# Patient Record
Sex: Female | Born: 1950 | Race: White | Hispanic: No | Marital: Married | Smoking: Former smoker
Health system: Southern US, Community
[De-identification: ages and names within clinical notes are randomized; demographics above are authoritative.]

## PROBLEM LIST (undated history)

## (undated) DIAGNOSIS — E213 Hyperparathyroidism, unspecified: Secondary | ICD-10-CM

## (undated) DIAGNOSIS — J302 Other seasonal allergic rhinitis: Secondary | ICD-10-CM

## (undated) DIAGNOSIS — K5792 Diverticulitis of intestine, part unspecified, without perforation or abscess without bleeding: Secondary | ICD-10-CM

## (undated) DIAGNOSIS — M1990 Unspecified osteoarthritis, unspecified site: Secondary | ICD-10-CM

## (undated) DIAGNOSIS — Z91018 Allergy to other foods: Secondary | ICD-10-CM

## (undated) DIAGNOSIS — E049 Nontoxic goiter, unspecified: Secondary | ICD-10-CM

## (undated) DIAGNOSIS — R112 Nausea with vomiting, unspecified: Secondary | ICD-10-CM

## (undated) DIAGNOSIS — C4491 Basal cell carcinoma of skin, unspecified: Secondary | ICD-10-CM

## (undated) DIAGNOSIS — I10 Essential (primary) hypertension: Secondary | ICD-10-CM

## (undated) DIAGNOSIS — J45909 Unspecified asthma, uncomplicated: Secondary | ICD-10-CM

## (undated) DIAGNOSIS — Z9889 Other specified postprocedural states: Secondary | ICD-10-CM

## (undated) DIAGNOSIS — F419 Anxiety disorder, unspecified: Secondary | ICD-10-CM

## (undated) HISTORY — PX: OTHER SURGICAL HISTORY: SHX169

## (undated) HISTORY — PX: CHOLECYSTECTOMY: SHX55

## (undated) HISTORY — PX: SKIN CANCER EXCISION: SHX779

## (undated) HISTORY — DX: Allergy to other foods: Z91.018

## (undated) HISTORY — PX: EYE SURGERY: SHX253

## (undated) HISTORY — PX: CERVICAL FUSION: SHX112

## (undated) HISTORY — DX: Hyperparathyroidism, unspecified: E21.3

## (undated) HISTORY — PX: BREAST SURGERY: SHX581

---

## 2000-01-12 HISTORY — PX: BREAST EXCISIONAL BIOPSY: SUR124

## 2004-04-27 ENCOUNTER — Ambulatory Visit: Admission: RE | Admit: 2004-04-27 | Discharge: 2004-04-27 | Payer: Self-pay | Admitting: Neurosurgery

## 2004-06-04 ENCOUNTER — Ambulatory Visit (HOSPITAL_COMMUNITY): Admission: RE | Admit: 2004-06-04 | Discharge: 2004-06-05 | Payer: Self-pay | Admitting: Neurosurgery

## 2004-08-10 ENCOUNTER — Encounter: Admission: RE | Admit: 2004-08-10 | Discharge: 2004-08-10 | Payer: Self-pay | Admitting: Internal Medicine

## 2005-05-26 ENCOUNTER — Encounter: Admission: RE | Admit: 2005-05-26 | Discharge: 2005-05-26 | Payer: Self-pay | Admitting: Internal Medicine

## 2005-08-11 ENCOUNTER — Encounter: Admission: RE | Admit: 2005-08-11 | Discharge: 2005-08-11 | Payer: Self-pay | Admitting: Internal Medicine

## 2006-02-25 ENCOUNTER — Encounter: Admission: RE | Admit: 2006-02-25 | Discharge: 2006-02-25 | Payer: Self-pay | Admitting: Obstetrics and Gynecology

## 2006-08-23 ENCOUNTER — Encounter: Admission: RE | Admit: 2006-08-23 | Discharge: 2006-08-23 | Payer: Self-pay | Admitting: Obstetrics and Gynecology

## 2007-08-31 ENCOUNTER — Encounter: Admission: RE | Admit: 2007-08-31 | Discharge: 2007-08-31 | Payer: Self-pay | Admitting: Internal Medicine

## 2008-09-02 ENCOUNTER — Encounter: Admission: RE | Admit: 2008-09-02 | Discharge: 2008-09-02 | Payer: Self-pay | Admitting: Internal Medicine

## 2009-09-04 ENCOUNTER — Encounter: Admission: RE | Admit: 2009-09-04 | Discharge: 2009-09-04 | Payer: Self-pay | Admitting: Internal Medicine

## 2009-12-24 ENCOUNTER — Encounter
Admission: RE | Admit: 2009-12-24 | Discharge: 2009-12-24 | Payer: Self-pay | Source: Home / Self Care | Attending: Internal Medicine | Admitting: Internal Medicine

## 2010-05-29 NOTE — Op Note (Signed)
Stacy Beasley, Stacy Beasley                  ACCOUNT NO.:  [id number]   MEDICAL RECORD NO.:  [id number]          PATIENT TYPE:  OIB   LOCATION:  3014                         FACILITY:  [facility]   PHYSICIAN:  Cristi Loron, M.D.DATE OF BIRTH:  Apr 03, 1950   DATE OF PROCEDURE:  06/04/2004  DATE OF DISCHARGE:                                 OPERATIVE REPORT   PREOPERATIVE DIAGNOSIS:  C5-6 spondylosis, stenosis, cervicalgia, cervical  radiculopathy.   POSTOPERATIVE DIAGNOSIS:  C5-6 spondylosis, stenosis, cervicalgia, cervical  radiculopathy.   OPERATION PERFORMED:  C5-6 extensive anterior cervical  diskectomy/decompression; interbody iliac crest allograft arthrodesis;  anterior cervical plating (Codman Slim Lock titanium plate and screws).   SURGEON:  Cristi Loron, M.D.   ASSISTANT:  Stefani Dama, M.D.   ANESTHESIA:  General endotracheal.   ESTIMATED BLOOD LOSS:  50 cc.   SPECIMENS:  None.   DRAINS:  None.   COMPLICATIONS:  None.   INDICATIONS FOR PROCEDURE:  The patient is a 60 year old white female who  has suffered from neck and arm pain.  She has failed medical management and  was worked up with a cervical MRI which demonstrated significant spondylosis  and stenosis at C5-6.  I discussed the various treatment options with the  patient including surgery.  The patient has weighed the risks, benefits and  alternatives to surgery and decided to proceed with a C5-6 anterior cervical  diskectomy, interbody iliac crest allograft arthrodesis with anterior  cervical plate.   DESCRIPTION OF PROCEDURE:  The patient was brought to the operating room by  the anesthesia team.  General endotracheal anesthesia was induced.  The  patient remained in supine position.  A roll was placed under the shoulders  to place the neck in slight extension.  The anterior cervical region was  then prepared with Betadine scrub and Betadine solution.  Sterile drapes  were applied.  I then injected the  area to be incised with Marcaine with  epinephrine solution and used a scalpel to make a transverse incision in the  patient's left anterior neck.  I used Metzenbaum scissors to divide the  platysma muscle and then to dissect medial to the sternocleidomastoid  muscle, jugular vein and carotid artery.  I bluntly dissected down toward  the anterior cervical spine, carefully identifying the esophagus and  retracting it medially.  I then used Kitner swabs to clear the soft tissue  from the anterior cervical spine.  I then inserted a bent spinal needle into  the exposed intervertebral interspace.  We obtained intraoperative  radiograph to confirm our location.   We then used electrocautery to detach the medial border of the longus colli  muscle bilaterally from the C5-6 intervertebral disk space.  I then inserted  a Caspar self-retaining retractor for exposure.   We then began the decompression by incising the C5-6 intervertebral disk  with a 15 blade scalpel.  The disk space was quite spondylotic and  collapsed.  We used the pituitary forceps to perform a partial diskectomy.  We inserted distraction screws at C5 and C6, distracted the interspace and  then used a high speed drill to decorticate vertebral end plates at Z6-1,  drill away the remainder of  the C5-6 intervertebral disk to drill away the  significant posterior spondylosis.  We thinned out the posterior  longitudinal ligament with the drill.  We then used the arachnoid knife to  incise the ligament and then removed it with the Kerrison punch undercutting  the vertebral end plates at W9-6, decompressing the thecal sac.  We then  performed a generous foraminotomy about the bilateral C6 nerve roots  completing the decompression.   We now turned our attention to arthrodesis.  We obtained iliac crest  tricortical allograft bone graft and fashioned it to these approximate  dimensions.  7 mm height, 1 cm in depth.  I inserted the bone  graft at the  distracted C5-6 interspace.  We removed the distraction screws and there was  a good snug fit of the bone graft, completing the arthrodesis.   We now turned our attention to the anterior spinal instrumentation. We  selected the appropriate length of Codman Slim Lock anterior cervical plate.  We then used the high speed drill to drill away some ventral spondylosis  from the C5-6 interspace so that the plate would lie down flat.  We then  laid the plate along the anterior aspect of the vertebral bodies at C5 and  C6.  We drilled two 12 mm holes at C5, two at C6.  We then secured the plate  to the vertebral bodies by placing two 12 mm self tapping screws at C5, two  at C6.  We then obtained an intraoperative radiograph to confirm our  location.  It confirmed that there was good position of the plates, screws,  interbody graft at C5-6.  We therefore secured the screws to the plate by  locking each cam.  We then obtained stringent hemostasis using bipolar  electrocautery.  We copiously irrigated the wound out with bacitracin  solution, removed the solution and then removed the Caspar self-retaining  retractor.  We inspected the esophagus for any damage.  There was none  apparent.  We then reapproximated the patient's platysma muscle with  interrupted 3-0 Vicryl sutures, subcutaneous tissues with interrupted 3-0  Vicryl suture and the skin with Benzoin and Steri-Strips.  The wound was  then coated with bacitracin ointment.  A dry sterile dressing was applied.  The drapes were removed.  The patient was subsequently extubated by the  anesthesia team and transported to the post anesthesia care unit in stable  condition.  All sponge, needle and instrument counts were correct at the end  of this case.      JDJ/MEDQ  D:  06/04/2004  T:  06/05/2004  Job:  [id number]

## 2010-07-28 ENCOUNTER — Other Ambulatory Visit: Payer: Self-pay | Admitting: Internal Medicine

## 2010-07-28 DIAGNOSIS — Z1231 Encounter for screening mammogram for malignant neoplasm of breast: Secondary | ICD-10-CM

## 2010-09-07 ENCOUNTER — Ambulatory Visit: Payer: Self-pay

## 2010-09-22 ENCOUNTER — Ambulatory Visit
Admission: RE | Admit: 2010-09-22 | Discharge: 2010-09-22 | Disposition: A | Discharge status: Home / Self Care | Payer: BC Managed Care – PPO | Source: Ambulatory Visit | Attending: Internal Medicine | Admitting: Internal Medicine

## 2010-09-22 DIAGNOSIS — Z1231 Encounter for screening mammogram for malignant neoplasm of breast: Secondary | ICD-10-CM

## 2011-08-19 ENCOUNTER — Other Ambulatory Visit: Payer: Self-pay | Admitting: Internal Medicine

## 2011-08-19 DIAGNOSIS — Z1231 Encounter for screening mammogram for malignant neoplasm of breast: Secondary | ICD-10-CM

## 2011-09-23 ENCOUNTER — Ambulatory Visit
Admission: RE | Admit: 2011-09-23 | Discharge: 2011-09-23 | Disposition: A | Discharge status: Home / Self Care | Payer: BC Managed Care – PPO | Source: Ambulatory Visit | Attending: Internal Medicine | Admitting: Internal Medicine

## 2011-09-23 DIAGNOSIS — Z1231 Encounter for screening mammogram for malignant neoplasm of breast: Secondary | ICD-10-CM

## 2012-08-24 ENCOUNTER — Other Ambulatory Visit: Payer: Self-pay

## 2012-08-24 DIAGNOSIS — Z1231 Encounter for screening mammogram for malignant neoplasm of breast: Secondary | ICD-10-CM

## 2012-09-25 ENCOUNTER — Ambulatory Visit
Admission: RE | Admit: 2012-09-25 | Discharge: 2012-09-25 | Disposition: A | Discharge status: Home / Self Care | Payer: BC Managed Care – PPO | Source: Ambulatory Visit

## 2012-09-25 DIAGNOSIS — Z1231 Encounter for screening mammogram for malignant neoplasm of breast: Secondary | ICD-10-CM

## 2012-12-11 ENCOUNTER — Other Ambulatory Visit: Payer: Self-pay | Admitting: Endocrinology

## 2012-12-11 DIAGNOSIS — E041 Nontoxic single thyroid nodule: Secondary | ICD-10-CM

## 2012-12-19 ENCOUNTER — Ambulatory Visit
Admission: RE | Admit: 2012-12-19 | Discharge: 2012-12-19 | Disposition: A | Discharge status: Home / Self Care | Payer: BC Managed Care – PPO | Source: Ambulatory Visit | Attending: Endocrinology | Admitting: Endocrinology

## 2012-12-19 ENCOUNTER — Other Ambulatory Visit (HOSPITAL_COMMUNITY)
Admission: RE | Admit: 2012-12-19 | Discharge: 2012-12-19 | Disposition: A | Discharge status: Home / Self Care | Payer: BC Managed Care – PPO | Source: Ambulatory Visit | Attending: Interventional Radiology | Admitting: Interventional Radiology

## 2012-12-19 DIAGNOSIS — E041 Nontoxic single thyroid nodule: Secondary | ICD-10-CM | POA: Insufficient documentation

## 2013-08-27 ENCOUNTER — Other Ambulatory Visit: Payer: Self-pay

## 2013-08-27 DIAGNOSIS — Z1231 Encounter for screening mammogram for malignant neoplasm of breast: Secondary | ICD-10-CM

## 2013-09-26 ENCOUNTER — Ambulatory Visit
Admission: RE | Admit: 2013-09-26 | Discharge: 2013-09-26 | Disposition: A | Discharge status: Home / Self Care | Payer: BC Managed Care – PPO | Source: Ambulatory Visit

## 2013-09-26 DIAGNOSIS — Z1231 Encounter for screening mammogram for malignant neoplasm of breast: Secondary | ICD-10-CM

## 2014-09-09 ENCOUNTER — Other Ambulatory Visit: Payer: Self-pay

## 2014-09-09 DIAGNOSIS — Z1231 Encounter for screening mammogram for malignant neoplasm of breast: Secondary | ICD-10-CM

## 2014-09-25 ENCOUNTER — Other Ambulatory Visit: Payer: Self-pay | Admitting: Orthopedic Surgery

## 2014-10-07 ENCOUNTER — Ambulatory Visit
Admission: RE | Admit: 2014-10-07 | Discharge: 2014-10-07 | Disposition: A | Discharge status: Home / Self Care | Payer: 59 | Source: Ambulatory Visit

## 2014-10-07 DIAGNOSIS — Z1231 Encounter for screening mammogram for malignant neoplasm of breast: Secondary | ICD-10-CM

## 2014-10-09 ENCOUNTER — Encounter (HOSPITAL_COMMUNITY): Payer: Self-pay

## 2014-10-09 ENCOUNTER — Encounter (HOSPITAL_COMMUNITY)
Admission: RE | Admit: 2014-10-09 | Discharge: 2014-10-09 | Disposition: A | Discharge status: Home / Self Care | Payer: 59 | Source: Ambulatory Visit | Attending: Orthopedic Surgery | Admitting: Orthopedic Surgery

## 2014-10-09 DIAGNOSIS — Z01818 Encounter for other preprocedural examination: Secondary | ICD-10-CM | POA: Insufficient documentation

## 2014-10-09 DIAGNOSIS — M2391 Unspecified internal derangement of right knee: Secondary | ICD-10-CM | POA: Diagnosis not present

## 2014-10-09 DIAGNOSIS — Z01812 Encounter for preprocedural laboratory examination: Secondary | ICD-10-CM | POA: Diagnosis not present

## 2014-10-09 DIAGNOSIS — I498 Other specified cardiac arrhythmias: Secondary | ICD-10-CM | POA: Diagnosis not present

## 2014-10-09 HISTORY — DX: Unspecified osteoarthritis, unspecified site: M19.90

## 2014-10-09 HISTORY — DX: Nausea with vomiting, unspecified: R11.2

## 2014-10-09 HISTORY — DX: Other seasonal allergic rhinitis: J30.2

## 2014-10-09 HISTORY — DX: Essential (primary) hypertension: I10

## 2014-10-09 HISTORY — DX: Nontoxic goiter, unspecified: E04.9

## 2014-10-09 HISTORY — DX: Unspecified asthma, uncomplicated: J45.909

## 2014-10-09 HISTORY — DX: Other specified postprocedural states: Z98.890

## 2014-10-09 LAB — BASIC METABOLIC PANEL
ANION GAP: 8 (ref 5–15)
BUN: 17 mg/dL (ref 6–20)
CO2: 30 mmol/L (ref 22–32)
Calcium: 10.1 mg/dL (ref 8.9–10.3)
Chloride: 102 mmol/L (ref 101–111)
Creatinine, Ser: 0.98 mg/dL (ref 0.44–1.00)
GFR calc Af Amer: >60 mL/min (ref >60)
GLUCOSE: 90 mg/dL (ref 65–99)
POTASSIUM: 3.9 mmol/L (ref 3.5–5.1)
Sodium: 140 mmol/L (ref 135–145)

## 2014-10-09 LAB — CBC
HCT: 39.9 % (ref 36.0–46.0)
Hemoglobin: 13.2 g/dL (ref 12.0–15.0)
MCH: 30.6 pg (ref 26.0–34.0)
MCHC: 33.1 g/dL (ref 30.0–36.0)
MCV: 92.6 fL (ref 78.0–100.0)
Platelets: 215 K/uL (ref 150–400)
RBC: 4.31 MIL/uL (ref 3.87–5.11)
RDW: 13.2 % (ref 11.5–15.5)
WBC: 6.6 K/uL (ref 4.0–10.5)

## 2014-10-09 NOTE — Pre-Procedure Instructions (Signed)
    JANESHIA CILIBERTO  10/09/2014      [facility] [place], [place] [address] [place] [address] Phone: [phone] Fax: [phone]    Your procedure is scheduled on 10-21-2014  Monday    Report to [facility] Admitting at 10:50 AM  .  Call this number if you have problems the morning of surgery:  [phone]   Remember:  Do not eat food or drink liquids after midnight.   Take these medicines the morning of surgery with A SIP OF WATER cetirizine(zyrtec)    Do not wear jewelry, make-up or nail polish.  Do not wear lotions, powders, or perfumes.  You may not wear deodorant.  Do not shave 48 hours prior to surgery.     Do not bring valuables to the hospital.  [facility] is not responsible for any belongings or valuables.  Contacts, dentures or bridgework may not be worn into surgery.  Leave your suitcase in the car.  After surgery it may be brought to your room.  For patients admitted to the hospital, discharge time will be determined by your treatment team.  Patients discharged the day of surgery will not be allowed to drive home.    Special instructions:  See attached Sheet for instructions on CHG shower.  Please read over the following fact sheets that you were given. Pain Booklet and Surgical Site Infection Prevention

## 2014-10-21 ENCOUNTER — Ambulatory Visit (HOSPITAL_COMMUNITY): Payer: 59 | Admitting: Anesthesiology

## 2014-10-21 ENCOUNTER — Ambulatory Visit (HOSPITAL_COMMUNITY)
Admission: RE | Admit: 2014-10-21 | Discharge: 2014-10-21 | Disposition: A | Discharge status: Home / Self Care | Payer: 59 | Source: Ambulatory Visit | Attending: Orthopedic Surgery | Admitting: Orthopedic Surgery

## 2014-10-21 ENCOUNTER — Encounter (HOSPITAL_COMMUNITY)
Admission: RE | Disposition: A | Discharge status: Home / Self Care | Payer: Self-pay | Source: Ambulatory Visit | Attending: Orthopedic Surgery

## 2014-10-21 ENCOUNTER — Encounter (HOSPITAL_COMMUNITY): Payer: Self-pay | Admitting: *Deleted

## 2014-10-21 DIAGNOSIS — X58XXXA Exposure to other specified factors, initial encounter: Secondary | ICD-10-CM | POA: Insufficient documentation

## 2014-10-21 DIAGNOSIS — M6751 Plica syndrome, right knee: Secondary | ICD-10-CM | POA: Insufficient documentation

## 2014-10-21 DIAGNOSIS — S83241A Other tear of medial meniscus, current injury, right knee, initial encounter: Secondary | ICD-10-CM | POA: Insufficient documentation

## 2014-10-21 DIAGNOSIS — I10 Essential (primary) hypertension: Secondary | ICD-10-CM | POA: Diagnosis not present

## 2014-10-21 DIAGNOSIS — M1711 Unilateral primary osteoarthritis, right knee: Secondary | ICD-10-CM | POA: Diagnosis not present

## 2014-10-21 DIAGNOSIS — Z87891 Personal history of nicotine dependence: Secondary | ICD-10-CM | POA: Diagnosis not present

## 2014-10-21 HISTORY — PX: KNEE ARTHROSCOPY: SHX127

## 2014-10-21 SURGERY — ARTHROSCOPY, KNEE
Anesthesia: Spinal | Site: Knee | Laterality: Right

## 2014-10-21 MED ORDER — ONDANSETRON HCL 4 MG/2ML IJ SOLN
INTRAMUSCULAR | Status: AC
  Filled 2014-10-21: qty 2

## 2014-10-21 MED ORDER — VANCOMYCIN HCL IN DEXTROSE 1-5 GM/200ML-% IV SOLN
1000.0000 mg | INTRAVENOUS | Status: AC
  Administered 2014-10-21: 1000 mg via INTRAVENOUS
  Filled 2014-10-21: qty 200

## 2014-10-21 MED ORDER — PROPOFOL 10 MG/ML IV BOLUS
INTRAVENOUS | Status: AC
  Filled 2014-10-21: qty 20

## 2014-10-21 MED ORDER — IBUPROFEN 600 MG PO TABS: 600.0000 mg | ORAL_TABLET | Freq: Three times a day (TID) | ORAL | Status: DC

## 2014-10-21 MED ORDER — PROPOFOL 10 MG/ML IV BOLUS
INTRAVENOUS | Status: DC | PRN
  Administered 2014-10-21: 20 mg via INTRAVENOUS
  Administered 2014-10-21 (×3): 40 mg via INTRAVENOUS
  Administered 2014-10-21: 20 mg via INTRAVENOUS

## 2014-10-21 MED ORDER — LIDOCAINE HCL (CARDIAC) 20 MG/ML IV SOLN
INTRAVENOUS | Status: AC
  Filled 2014-10-21: qty 5

## 2014-10-21 MED ORDER — CHLORHEXIDINE GLUCONATE 4 % EX LIQD: 60.0000 mL | Freq: Once | CUTANEOUS | Status: DC

## 2014-10-21 MED ORDER — ONDANSETRON HCL 4 MG/2ML IJ SOLN
INTRAMUSCULAR | Status: DC | PRN
  Administered 2014-10-21: 4 mg via INTRAVENOUS

## 2014-10-21 MED ORDER — GLYCOPYRROLATE 0.2 MG/ML IJ SOLN
INTRAMUSCULAR | Status: AC
  Filled 2014-10-21: qty 1

## 2014-10-21 MED ORDER — LACTATED RINGERS IV SOLN
INTRAVENOUS | Status: DC
  Administered 2014-10-21 (×2): via INTRAVENOUS

## 2014-10-21 MED ORDER — GLYCOPYRROLATE 0.2 MG/ML IJ SOLN
INTRAMUSCULAR | Status: DC | PRN
  Administered 2014-10-21: 0.2 mg via INTRAVENOUS

## 2014-10-21 MED ORDER — HYDROCODONE-ACETAMINOPHEN 5-325 MG PO TABS: 1.0000 | ORAL_TABLET | ORAL | Status: DC | PRN

## 2014-10-21 MED ORDER — DEXAMETHASONE SODIUM PHOSPHATE 4 MG/ML IJ SOLN
INTRAMUSCULAR | Status: DC | PRN
  Administered 2014-10-21: 4 mg via INTRAVENOUS

## 2014-10-21 MED ORDER — LIDOCAINE HCL (CARDIAC) 20 MG/ML IV SOLN
INTRAVENOUS | Status: DC | PRN
  Administered 2014-10-21: 60 mg via INTRAVENOUS

## 2014-10-21 MED ORDER — DEXAMETHASONE SODIUM PHOSPHATE 4 MG/ML IJ SOLN
INTRAMUSCULAR | Status: AC
  Filled 2014-10-21: qty 1

## 2014-10-21 MED ORDER — MIDAZOLAM HCL 2 MG/2ML IJ SOLN
INTRAMUSCULAR | Status: DC | PRN
  Administered 2014-10-21 (×2): 1 mg via INTRAVENOUS

## 2014-10-21 MED ORDER — MIDAZOLAM HCL 2 MG/2ML IJ SOLN
INTRAMUSCULAR | Status: AC
  Filled 2014-10-21: qty 4

## 2014-10-21 MED ORDER — PROPOFOL 500 MG/50ML IV EMUL
INTRAVENOUS | Status: DC | PRN
  Administered 2014-10-21: 25 ug/kg/min via INTRAVENOUS

## 2014-10-21 MED ORDER — SODIUM CHLORIDE 0.9 % IR SOLN
Status: DC | PRN
  Administered 2014-10-21: 3000 mL

## 2014-10-21 MED ORDER — BUPIVACAINE IN DEXTROSE 0.75-8.25 % IT SOLN
INTRATHECAL | Status: DC | PRN
  Administered 2014-10-21: 1.4 mL via INTRATHECAL

## 2014-10-21 SURGICAL SUPPLY — 29 items
BANDAGE ELASTIC 6 VELCRO ST LF (GAUZE/BANDAGES/DRESSINGS) ×2 IMPLANT
BLADE GREAT WHITE 4.2 (BLADE) ×2 IMPLANT
DRAPE ARTHROSCOPY W/POUCH 114 (DRAPES) ×2 IMPLANT
DRAPE U-SHAPE 47X51 STRL (DRAPES) ×2 IMPLANT
GAUZE SPONGE 4X4 12PLY STRL (GAUZE/BANDAGES/DRESSINGS) ×2 IMPLANT
GAUZE XEROFORM 1X8 LF (GAUZE/BANDAGES/DRESSINGS) ×2 IMPLANT
GLOVE BIO SURGEON STRL SZ 6.5 (GLOVE) ×4 IMPLANT
GLOVE BIOGEL PI IND STRL 8.5 (GLOVE) ×2 IMPLANT
GLOVE BIOGEL PI INDICATOR 8.5 (GLOVE) ×2
GLOVE SURG ORTHO 8.0 STRL STRW (GLOVE) ×4 IMPLANT
GOWN STRL REUS W/ TWL LRG LVL3 (GOWN DISPOSABLE) ×1 IMPLANT
GOWN STRL REUS W/TWL LRG LVL3 (GOWN DISPOSABLE) ×1
GOWN STRL REUS W/TWL XL LVL3 (GOWN DISPOSABLE) ×4 IMPLANT
KIT ROOM TURNOVER OR (KITS) ×2 IMPLANT
MANIFOLD NEPTUNE II (INSTRUMENTS) ×2 IMPLANT
PACK ARTHROSCOPY DSU (CUSTOM PROCEDURE TRAY) ×2 IMPLANT
PAD ARMBOARD 7.5X6 YLW CONV (MISCELLANEOUS) ×4 IMPLANT
PAD CAST 4YDX4 CTTN HI CHSV (CAST SUPPLIES) ×1 IMPLANT
PADDING CAST COTTON 4X4 STRL (CAST SUPPLIES) ×1
SET ARTHROSCOPY TUBING (MISCELLANEOUS) ×1
SET ARTHROSCOPY TUBING LN (MISCELLANEOUS) ×1 IMPLANT
SPONGE GAUZE 4X4 12PLY STER LF (GAUZE/BANDAGES/DRESSINGS) ×2 IMPLANT
SPONGE LAP 4X18 X RAY DECT (DISPOSABLE) ×2 IMPLANT
SUT ETHILON 4 0 PS 2 18 (SUTURE) ×2 IMPLANT
SYR 30ML LL (SYRINGE) IMPLANT
TOWEL OR 17X24 6PK STRL BLUE (TOWEL DISPOSABLE) ×2 IMPLANT
TOWEL OR 17X26 10 PK STRL BLUE (TOWEL DISPOSABLE) ×2 IMPLANT
WAND 90 DEG TURBOVAC W/CORD (SURGICAL WAND) ×2 IMPLANT
WATER STERILE IRR 1000ML POUR (IV SOLUTION) ×2 IMPLANT

## 2014-10-21 NOTE — Transfer of Care (Signed)
Immediate Anesthesia Transfer of Care Note  Patient: Stacy Beasley  Procedure(s) Performed: Procedure(s): ARTHROSCOPY KNEE (Right)  Patient Location: PACU  Anesthesia Type:Spinal  Level of Consciousness: awake, alert  and oriented  Airway & Oxygen Therapy: Patient Spontanous Breathing and Patient connected to nasal cannula oxygen  Post-op Assessment: Report given to RN, Post -op Vital signs reviewed and stable and Patient moving all extremities X 4  Post vital signs: Reviewed and stable  Last Vitals:  Filed Vitals:   10/21/14 1050  BP: 143/84  Pulse: 71  Temp: 36.5 C  Resp: 20    Complications: No apparent anesthesia complications

## 2014-10-21 NOTE — H&P (Signed)
  Stacy Beasley MRN:  [id number] DOB/SEX:  1950-05-07/female  CHIEF COMPLAINT:  Painful right Knee  HISTORY: Patient is a 64 y.o. female presented with a history of pain in the right knee. Onset of symptoms was abrupt starting several weeks ago with rapidly worsening course since that time. Prior procedures on the knee include none. Patient has been treated conservatively with over-the-counter NSAIDs and activity modification. Patient currently rates pain in the knee at 9 out of 10 with activity. There is no pain at night.  PAST MEDICAL HISTORY: There are no active problems to display for this patient.  Past Medical History  Diagnosis Date  . PONV (postoperative nausea and vomiting)   . Hypertension   . Asthma     as child  . Goiter, non-toxic   . Arthritis   . Seasonal allergies    Past Surgical History  Procedure Laterality Date  . Cervical fusion    . Cholecystectomy    . Eye surgery Bilateral     cataracts  . Arthroscopic knee  Left   . Breast surgery      lumpectomy     MEDICATIONS:   No prescriptions prior to admission    ALLERGIES:   Allergies  Allergen Reactions  . Doxycycline   . Penicillins   . Phenobarbital Other (See Comments)    hyperactivity  . Levaquin [Levofloxacin In D5w] Rash    REVIEW OF SYSTEMS:  Pertinent items noted in HPI and remainder of comprehensive ROS otherwise negative.   FAMILY HISTORY:  No family history on file.  SOCIAL HISTORY:   Social History  Substance Use Topics  . Smoking status: Former Smoker -- 1.00 packs/day for 8 years    Types: Cigarettes    Quit date: 01/11/1978  . Smokeless tobacco: Not on file  . Alcohol Use: Yes     Comment: socially     EXAMINATION:  Vital signs in last 24 hours:    General appearance: alert, cooperative and no distress Lungs: clear to auscultation bilaterally Heart: regular rate and rhythm, S1, S2 normal, no murmur, click, rub or gallop Abdomen: soft, non-tender; bowel sounds normal;  no masses,  no organomegaly Extremities: extremities normal, atraumatic, no cyanosis or edema and Homans sign is negative, no sign of DVT Pulses: 2+ and symmetric Skin: Skin color, texture, turgor normal. No rashes or lesions Neurologic: Alert and oriented X 3, normal strength and tone. Normal symmetric reflexes. Normal coordination and gait  Musculoskeletal:  ROM 0-110, Ligaments intact,  Imaging Review Plain radiographs demonstrate mild degenerative joint disease of the right knee. The overall alignment is neutral. The bone quality appears to be good for age and reported activity level.  Assessment/Plan:  right knee meniscal tear  The patient history, physical examination and imaging studies are consistent with mild degenerative joint disease of the right knee. The patient has failed conservative treatment.  The clearance notes were reviewed.  After discussion with the patient it was felt that  Knee Arthoscopy was indicated.   Channell Quattrone 10/21/2014, 6:49 AM

## 2014-10-21 NOTE — Anesthesia Postprocedure Evaluation (Signed)
  Anesthesia Post-op Note  Patient: Stacy Beasley  Procedure(s) Performed: Procedure(s) (LRB): ARTHROSCOPY KNEE (Right)  Patient Location: PACU  Anesthesia Type: Spinal  Level of Consciousness: awake and alert   Airway and Oxygen Therapy: Patient Spontanous Breathing  Post-op Pain: mild  Post-op Assessment: Post-op Vital signs reviewed, Patient's Cardiovascular Status Stable, Respiratory Function Stable, Patent Airway and No signs of Nausea or vomiting  Last Vitals:  Filed Vitals:   10/21/14 1410  BP:   Pulse:   Temp: 36.4 C  Resp:     Post-op Vital Signs: stable   Complications: No apparent anesthesia complications

## 2014-10-21 NOTE — Progress Notes (Signed)
Orthopedic Tech Progress Note Patient Details:  Stacy Beasley 1950-10-09 [id number]  Ortho Devices Type of Ortho Device: Crutches Ortho Device/Splint Interventions: Application Viewed order from RN order list  Hildred Priest 10/21/2014, 3:14 PM

## 2014-10-21 NOTE — Anesthesia Preprocedure Evaluation (Signed)
Anesthesia Evaluation  Patient identified by MRN, date of birth, ID band Patient awake    Reviewed: Allergy & Precautions, NPO status , Patient's Chart, lab work & pertinent test results  Airway Mallampati: II  TM Distance: >3 FB Neck ROM: Full    Dental no notable dental hx.    Pulmonary neg pulmonary ROS, former smoker,    Pulmonary exam normal breath sounds clear to auscultation       Cardiovascular hypertension, Pt. on medications Normal cardiovascular exam Rhythm:Regular Rate:Normal     Neuro/Psych negative neurological ROS  negative psych ROS   GI/Hepatic negative GI ROS, Neg liver ROS,   Endo/Other  negative endocrine ROS  Renal/GU negative Renal ROS  negative genitourinary   Musculoskeletal negative musculoskeletal ROS (+)   Abdominal   Peds negative pediatric ROS (+)  Hematology negative hematology ROS (+)   Anesthesia Other Findings   Reproductive/Obstetrics negative OB ROS                             Anesthesia Physical Anesthesia Plan  ASA: II  Anesthesia Plan: Spinal   Post-op Pain Management:    Induction: Intravenous  Airway Management Planned:   Additional Equipment:   Intra-op Plan:   Post-operative Plan:   Informed Consent: I have reviewed the patients History and Physical, chart, labs and discussed the procedure including the risks, benefits and alternatives for the proposed anesthesia with the patient or authorized representative who has indicated his/her understanding and acceptance.   Dental advisory given  Plan Discussed with: CRNA and Surgeon  Anesthesia Plan Comments:         Anesthesia Quick Evaluation

## 2014-10-21 NOTE — Anesthesia Procedure Notes (Signed)
Spinal Patient location during procedure: OR Staffing Performed by: anesthesiologist  Preanesthetic Checklist Completed: patient identified, site marked, surgical consent, pre-op evaluation, timeout performed, IV checked, risks and benefits discussed and monitors and equipment checked Spinal Block Patient position: sitting Prep: Betadine Patient monitoring: heart rate, continuous pulse ox and blood pressure Injection technique: single-shot Needle Needle type: Spinocan  Needle gauge: 22 G Needle length: 9 cm Additional Notes Expiration date of kit checked and confirmed. Patient tolerated procedure well, without complications.  Attempt x 3, significant scoliosis

## 2014-10-21 NOTE — Discharge Instructions (Signed)
Diet: As you were doing prior to hospitalization   Activity:  Increase activity slowly as tolerated                  No lifting or driving for 6 weeks  Shower:  May shower without a dressing once there is no drainage from your wound.                 Do NOT wash over the wound.                 Dressing:  You may change your dressing on Wednesday                    Then change the dressing daily with sterile 4"x4"s gauze dressing                     And ACE wrap hose for knees.  Weight Bearing:  Weight bearing as tolerated as taught in physical therapy.  Use a                                walker or Crutches as instructed.  To prevent constipation: you may use a stool softener such as -               Colace ( over the counter) 100 mg by mouth twice a day                Drink plenty of fluids ( prune juice may be helpful) and high fiber foods                Miralax ( over the counter) for constipation as needed.    Precautions:  If you experience chest pain or shortness of breath - call 911 immediately               For transfer to the hospital emergency department!!               If you develop a fever greater that 101 F, purulent drainage from wound,                             increased redness or drainage from wound, or calf pain -- Call the office.  Follow- Up Appointment:  Please call for an appointment to be seen on 10/24/14                                              [facility] office:  [phone]            [address] [place], [place] [address]

## 2014-10-22 ENCOUNTER — Encounter (HOSPITAL_COMMUNITY): Payer: Self-pay | Admitting: Orthopedic Surgery

## 2014-10-22 NOTE — Op Note (Signed)
Dictation Number:  [id number]

## 2014-10-23 NOTE — Op Note (Signed)
Stacy Beasley, Stacy Beasley                  ACCOUNT NO.:  [id number]  MEDICAL RECORD NO.:  [id number]  LOCATION:  [facility]                         FACILITY:  [facility]  PHYSICIAN:  Estill Bamberg. Ronnie Derby, M.D. DATE OF BIRTH:  02/17/1950  DATE OF PROCEDURE:  10/21/2014 DATE OF DISCHARGE:  10/21/2014                              OPERATIVE REPORT   SURGEON:  Estill Bamberg. Ronnie Derby, M.D.  ASSISTANT:  Carlynn Spry, PA-C.  ANESTHESIA:  General.  PREOPERATIVE DIAGNOSIS:  Right knee osteoarthritis, medial meniscal tear, and plica syndrome.  POSTOPERATIVE DIAGNOSIS:  Right knee osteoarthritis, medial meniscal tear, and plica syndrome.  PROCEDURE:  Right knee arthroscopy with chondroplasty in the patellofemoral joint, plicectomy in the patellofemoral joint, and partial medial meniscectomy.  INDICATION FOR PROCEDURE:  The patient is a 64 year old, white female, with mechanical symptoms.  MRI evidence of meniscal tearing.  An informed consent was obtained.  DESCRIPTION OF PROCEDURE:  The patient was laid supine, administered general anesthesia.  The right knee was prepped and draped in usual fashion.  Inferolateral and inferomedial portals were created with a #11 blade.  Blunt trocar and cannula.  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  Chondroplasty is carried out.  Small [place] shaver through the anteromedial portal. There was a large mature medial plica, this was resected with a small [employer] shaver elicited.  They went into plica .  The medial compartment showed grade II to III chondromalacia and right chondroplasty was carried out.  There was dense tearing in the entirety of the posterior horn of the meniscus.  Straight basket forceps and [place] shaver was used to perform partial medial meniscectomy.  ACL and PCL were normal. The lateral compartment looked good.  I then lavaged with sterile 3 compartments yet again.  I do think that the  patient came to arthroplasty, partial knee replacement would be reasonable.  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  We dressed with Xeroform, dressing, sponges, sterile Webril, and Ace wrap.  COMPLICATIONS:  None.  DRAINS:  None.          ______________________________ Estill Bamberg. Ronnie Derby, M.D.     SDL/MEDQ  D:  10/22/2014  T:  10/23/2014  Job:  [id number]

## 2015-09-09 ENCOUNTER — Other Ambulatory Visit: Payer: Self-pay | Admitting: Internal Medicine

## 2015-09-09 DIAGNOSIS — Z1231 Encounter for screening mammogram for malignant neoplasm of breast: Secondary | ICD-10-CM

## 2015-10-13 ENCOUNTER — Ambulatory Visit
Admission: RE | Admit: 2015-10-13 | Discharge: 2015-10-13 | Disposition: A | Discharge status: Home / Self Care | Payer: BLUE CROSS/BLUE SHIELD | Source: Ambulatory Visit | Attending: Internal Medicine | Admitting: Internal Medicine

## 2015-10-13 DIAGNOSIS — Z1231 Encounter for screening mammogram for malignant neoplasm of breast: Secondary | ICD-10-CM

## 2015-10-14 ENCOUNTER — Other Ambulatory Visit: Payer: Self-pay | Admitting: Internal Medicine

## 2015-10-14 DIAGNOSIS — N6459 Other signs and symptoms in breast: Secondary | ICD-10-CM

## 2015-10-16 ENCOUNTER — Ambulatory Visit
Admission: RE | Admit: 2015-10-16 | Discharge: 2015-10-16 | Disposition: A | Discharge status: Home / Self Care | Payer: BLUE CROSS/BLUE SHIELD | Source: Ambulatory Visit | Attending: Internal Medicine | Admitting: Internal Medicine

## 2015-10-16 DIAGNOSIS — N6459 Other signs and symptoms in breast: Secondary | ICD-10-CM

## 2016-03-17 DIAGNOSIS — J209 Acute bronchitis, unspecified: Secondary | ICD-10-CM | POA: Diagnosis not present

## 2016-03-21 DIAGNOSIS — J209 Acute bronchitis, unspecified: Secondary | ICD-10-CM | POA: Diagnosis not present

## 2016-05-10 DIAGNOSIS — M1711 Unilateral primary osteoarthritis, right knee: Secondary | ICD-10-CM | POA: Diagnosis not present

## 2016-06-01 DIAGNOSIS — M150 Primary generalized (osteo)arthritis: Secondary | ICD-10-CM | POA: Diagnosis not present

## 2016-06-01 DIAGNOSIS — F419 Anxiety disorder, unspecified: Secondary | ICD-10-CM | POA: Diagnosis not present

## 2016-06-01 DIAGNOSIS — E782 Mixed hyperlipidemia: Secondary | ICD-10-CM | POA: Diagnosis not present

## 2016-06-01 DIAGNOSIS — D801 Nonfamilial hypogammaglobulinemia: Secondary | ICD-10-CM | POA: Diagnosis not present

## 2016-06-01 DIAGNOSIS — I10 Essential (primary) hypertension: Secondary | ICD-10-CM | POA: Diagnosis not present

## 2016-06-01 DIAGNOSIS — I7090 Unspecified atherosclerosis: Secondary | ICD-10-CM | POA: Diagnosis not present

## 2016-06-01 DIAGNOSIS — K219 Gastro-esophageal reflux disease without esophagitis: Secondary | ICD-10-CM | POA: Diagnosis not present

## 2016-06-01 DIAGNOSIS — L508 Other urticaria: Secondary | ICD-10-CM | POA: Diagnosis not present

## 2016-06-01 DIAGNOSIS — E049 Nontoxic goiter, unspecified: Secondary | ICD-10-CM | POA: Diagnosis not present

## 2016-06-01 DIAGNOSIS — J3089 Other allergic rhinitis: Secondary | ICD-10-CM | POA: Diagnosis not present

## 2016-07-05 DIAGNOSIS — E782 Mixed hyperlipidemia: Secondary | ICD-10-CM | POA: Diagnosis not present

## 2016-07-05 DIAGNOSIS — E049 Nontoxic goiter, unspecified: Secondary | ICD-10-CM | POA: Diagnosis not present

## 2016-07-05 DIAGNOSIS — D801 Nonfamilial hypogammaglobulinemia: Secondary | ICD-10-CM | POA: Diagnosis not present

## 2016-07-06 DIAGNOSIS — T1501XA Foreign body in cornea, right eye, initial encounter: Secondary | ICD-10-CM | POA: Diagnosis not present

## 2016-07-07 DIAGNOSIS — D225 Melanocytic nevi of trunk: Secondary | ICD-10-CM | POA: Diagnosis not present

## 2016-07-07 DIAGNOSIS — D1801 Hemangioma of skin and subcutaneous tissue: Secondary | ICD-10-CM | POA: Diagnosis not present

## 2016-07-07 DIAGNOSIS — L821 Other seborrheic keratosis: Secondary | ICD-10-CM | POA: Diagnosis not present

## 2016-07-07 DIAGNOSIS — D2239 Melanocytic nevi of other parts of face: Secondary | ICD-10-CM | POA: Diagnosis not present

## 2016-08-31 ENCOUNTER — Other Ambulatory Visit: Payer: Self-pay | Admitting: Internal Medicine

## 2016-08-31 DIAGNOSIS — Z1231 Encounter for screening mammogram for malignant neoplasm of breast: Secondary | ICD-10-CM

## 2016-09-05 IMAGING — MG MM SCREENING BREAST TOMO BILATERAL
8 series · 8 of 24 positions shown · non-contrast
Comparison: Previous exam(s).

CLINICAL DATA: Screening.

EXAM:
DIGITAL SCREENING BILATERAL MAMMOGRAM WITH 3D TOMO WITH CAD

[L CC]
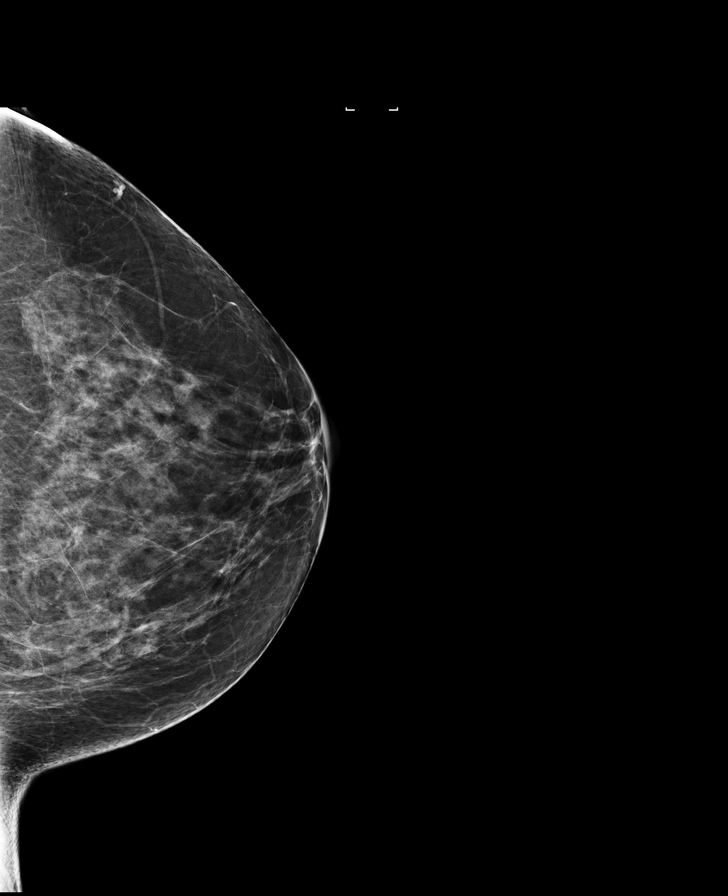

[L MLO]
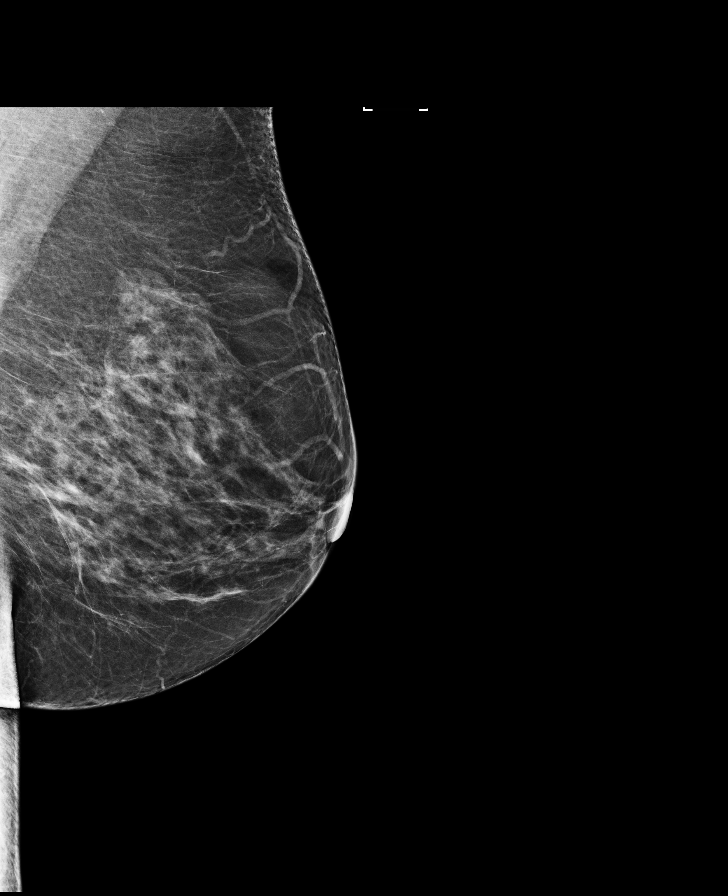

[R CC]
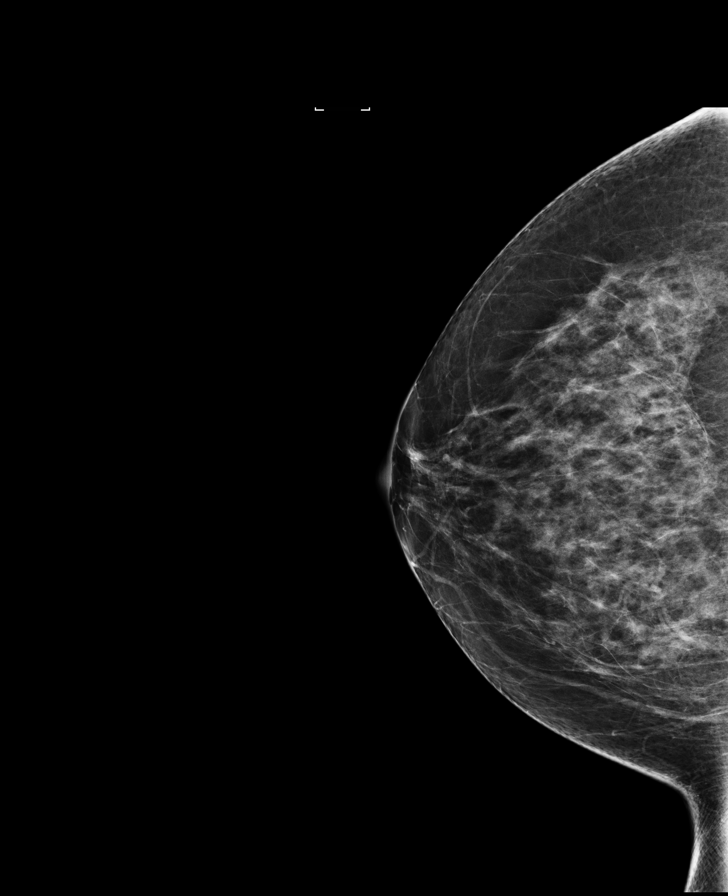

[R MLO]
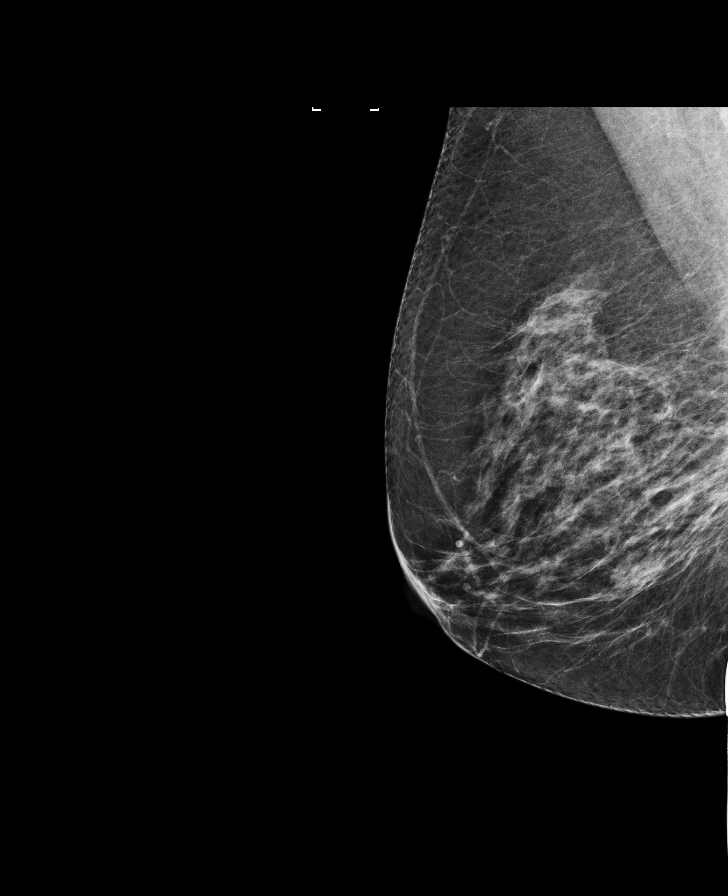

[R MLO tomo · tomo slice 37/72.0]
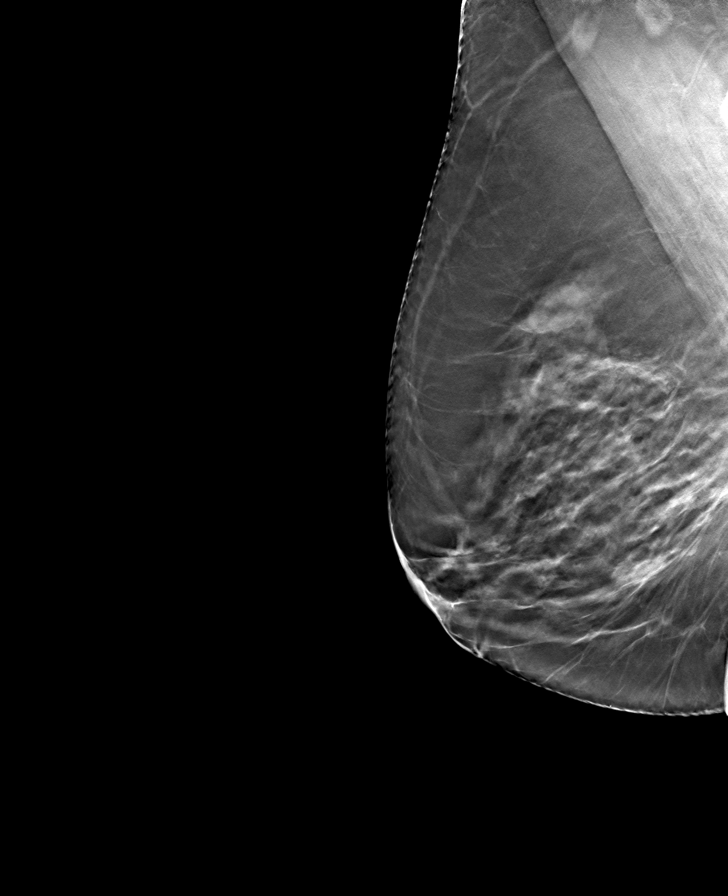

[R CC tomo · tomo slice 31/62.0]
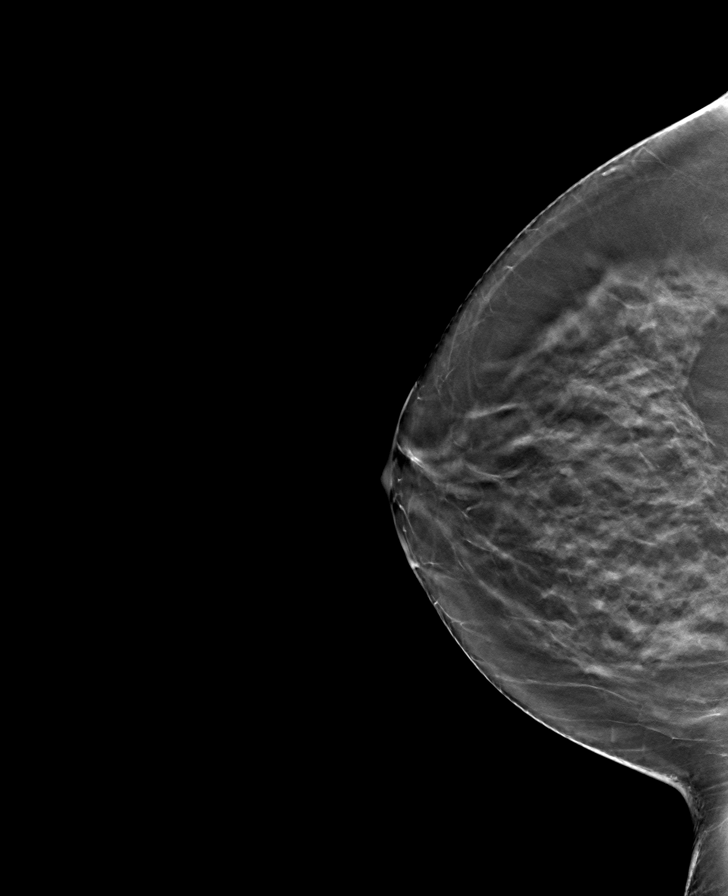

[L MLO tomo · tomo slice 36/71.0]
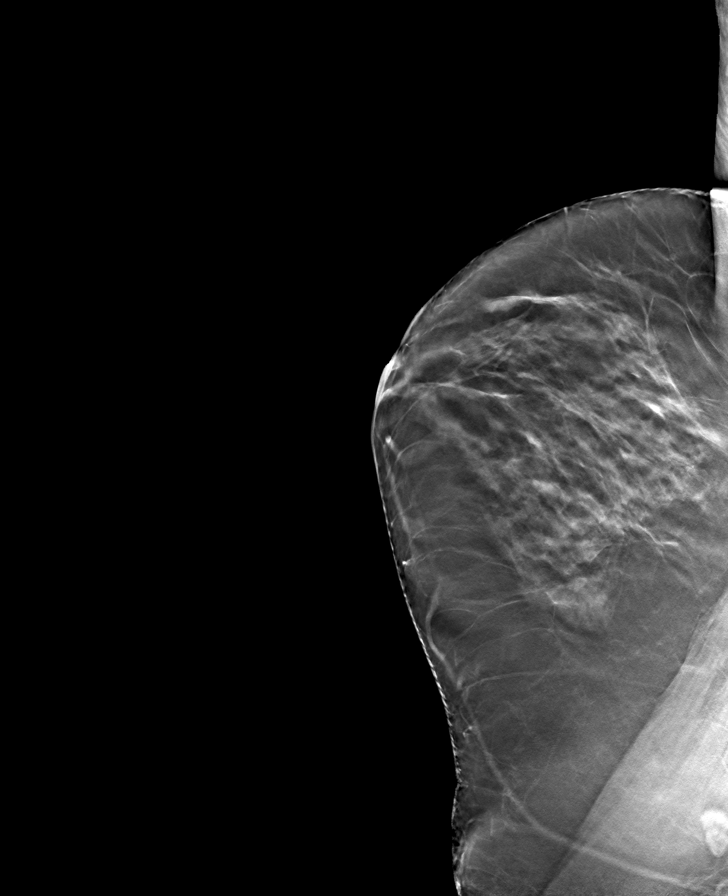

[L CC tomo · tomo slice 33/64.0]
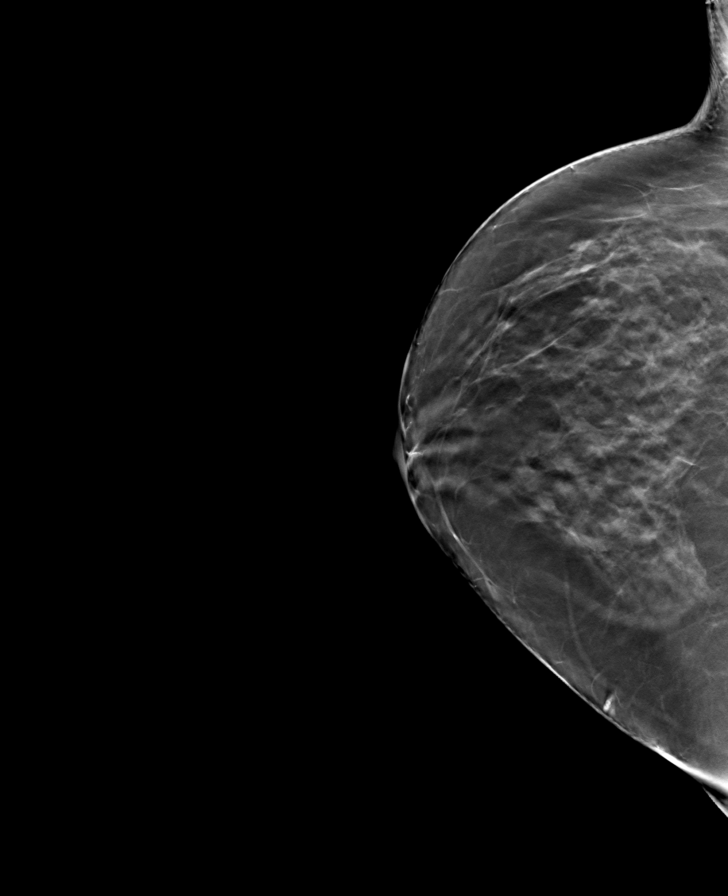

[8 of 24 positions shown; findings below may reference images not displayed]

ACR Breast Density Category c: The breast tissue is heterogeneously
dense, which may obscure small masses.
FINDINGS: There are no findings suspicious for malignancy. Images were
processed with CAD.
IMPRESSION: No mammographic evidence of malignancy. A result letter of this
screening mammogram will be mailed directly to the patient.

RECOMMENDATION:
Screening mammogram in one year. (Code:OA-G-1SS)

BI-RADS CATEGORY  1: Negative.

## 2016-10-18 ENCOUNTER — Ambulatory Visit: Payer: BLUE CROSS/BLUE SHIELD

## 2016-11-15 DIAGNOSIS — I10 Essential (primary) hypertension: Secondary | ICD-10-CM | POA: Diagnosis not present

## 2016-11-15 DIAGNOSIS — M150 Primary generalized (osteo)arthritis: Secondary | ICD-10-CM | POA: Diagnosis not present

## 2016-11-15 DIAGNOSIS — M791 Myalgia, unspecified site: Secondary | ICD-10-CM | POA: Diagnosis not present

## 2016-11-15 DIAGNOSIS — E782 Mixed hyperlipidemia: Secondary | ICD-10-CM | POA: Diagnosis not present

## 2016-11-15 DIAGNOSIS — I7090 Unspecified atherosclerosis: Secondary | ICD-10-CM | POA: Diagnosis not present

## 2016-12-13 DIAGNOSIS — K7689 Other specified diseases of liver: Secondary | ICD-10-CM | POA: Diagnosis not present

## 2016-12-13 DIAGNOSIS — R5381 Other malaise: Secondary | ICD-10-CM | POA: Diagnosis not present

## 2016-12-13 DIAGNOSIS — R5383 Other fatigue: Secondary | ICD-10-CM | POA: Diagnosis not present

## 2017-01-06 ENCOUNTER — Ambulatory Visit (INDEPENDENT_AMBULATORY_CARE_PROVIDER_SITE_OTHER): Payer: PPO | Admitting: Allergy and Immunology

## 2017-01-06 ENCOUNTER — Encounter: Payer: Self-pay | Admitting: Allergy and Immunology

## 2017-01-06 VITALS — BP 144/82 | HR 64 | Temp 98.3°F | Resp 16 | Ht 65.75 in | Wt 164.4 lb

## 2017-01-06 DIAGNOSIS — J4520 Mild intermittent asthma, uncomplicated: Secondary | ICD-10-CM | POA: Diagnosis not present

## 2017-01-06 DIAGNOSIS — G43909 Migraine, unspecified, not intractable, without status migrainosus: Secondary | ICD-10-CM | POA: Diagnosis not present

## 2017-01-06 DIAGNOSIS — F458 Other somatoform disorders: Secondary | ICD-10-CM | POA: Diagnosis not present

## 2017-01-06 DIAGNOSIS — T7840XA Allergy, unspecified, initial encounter: Secondary | ICD-10-CM | POA: Diagnosis not present

## 2017-01-06 NOTE — Progress Notes (Signed)
Dear Dr. Bea Graff,  Thank you for referring Stacy Beasley to the [facility] on 01/06/2017.   Below is a summation of this patient's evaluation and recommendations.  Thank you for your referral. I will keep you informed about this patient's response to treatment.   If you have any questions please do not hesitate to contact me.   Sincerely,  Stacy Prows, MD Allergy / Immunology [place]   ______________________________________________________________________    NEW PATIENT NOTE  Referring Provider: Raina Mina., MD Primary Provider: Raina Mina., MD Date of office visit: 01/06/2017    Subjective:   Chief Complaint:  Stacy Beasley (DOB: August 21, 1950) is a 66 y.o. female who presents to the clinic on 01/06/2017 with a chief complaint of Chest Pain; lip tingling; Wheezing; and Cough .     HPI: Stacy Beasley presents to this clinic in evaluation of "allergic reactions".  She has a history of idiopathic urticaria that has been evaluated by a dermatologist, an allergist, and a rheumatologist back in 2015 and 2016 but fortunately this issue appears to have abated.  She also has a history of seasonal allergic rhinitis usually during the spring and summer.  For both these conditions she will use Zyrtec every day and occasionally will use a nasal steroid and she feels that she is under very good control while utilizing this form of treatment.  In addition, she has a history of intermittent asthma for which she uses a short acting bronchodilator very infrequently usually only upon exposure to chicken houses.  The issue at hand, that has developed over the course of the past year, has been these episodes of "allergic reactions" presenting as a headache on the front and side of her head that she describes as constant and pressure-like without any associated scotoma or other neurological symptoms, in  conjunction with lip tingling and chest tightness.  She will take an additional Zyrtec and use a short acting bronchodilator for these issues.  Apparently she does respond to a short acting bronchodilator in regard to her chest tightness and her headache will last somewhere to 30-60 minutes.  Her last episode was about 2 months ago.  She has had a total of 5 of these episodes in 2018.  Each 1 of these episodes appears to occur soon after completing a meal.  However, there is no consistent food consumption that associates with this issue.  On several occasions she thought that this was secondary to the consumption of very spicy meats such as preseason chicken and a telemetry and sausage.  She has no other associated systemic or constitutional symptoms.  Past Medical History:  Diagnosis Date  . Arthritis   . Asthma    as child  . Goiter, non-toxic   . Hypertension   . PONV (postoperative nausea and vomiting)   . Seasonal allergies     Past Surgical History:  Procedure Laterality Date  . arthroscopic knee  Left   . BREAST SURGERY     lumpectomy  . CERVICAL FUSION    . CHOLECYSTECTOMY    . EYE SURGERY Bilateral    cataracts  . KNEE ARTHROSCOPY Right 10/21/2014   Procedure: ARTHROSCOPY KNEE;  Surgeon: Vickey Huger, MD;  Location: [facility];  Service: Orthopedics;  Laterality: Right;    Allergies as of 01/06/2017      Reactions   Doxycycline    Penicillins  Phenobarbital Other (See Comments)   hyperactivity   Levaquin [levofloxacin In D5w] Rash      Medication List      albuterol 108 (90 Base) MCG/ACT inhaler Commonly known as:  PROVENTIL HFA;VENTOLIN HFA Inhale 2 puffs into the lungs every 4 (four) hours as needed for wheezing or shortness of breath.   cetirizine 10 MG chewable tablet Commonly known as:  ZYRTEC Chew 10 mg by mouth daily.   fluticasone 50 MCG/ACT nasal spray Commonly known as:  FLONASE Place 1 spray into both nostrils daily.   hydrochlorothiazide 12.5 MG  capsule Commonly known as:  MICROZIDE Take 12.5 mg by mouth daily.   ibuprofen 600 MG tablet Commonly known as:  ADVIL,MOTRIN Take 1 tablet (600 mg total) by mouth 3 (three) times daily.   LORazepam 0.5 MG tablet Commonly known as:  ATIVAN Take 0.5 mg by mouth at bedtime.   Melatonin 3 MG Tabs Take 1 tablet by mouth at bedtime.   ranitidine 150 MG capsule Commonly known as:  ZANTAC Take 150 mg by mouth every evening.   Vitamin D3 3000 units Tabs Take 1 capsule by mouth daily.        Review of systems negative except as noted in HPI / PMHx or noted below:  Review of Systems  Constitutional: Negative.   HENT: Negative.   Eyes: Negative.   Respiratory: Negative.   Cardiovascular: Negative.   Gastrointestinal: Negative.   Genitourinary: Negative.   Musculoskeletal: Negative.   Skin: Negative.   Neurological: Negative.   Endo/Heme/Allergies: Negative.   Psychiatric/Behavioral: Negative.     Family History  Problem Relation Age of Onset  . COPD Mother   . High blood pressure Mother   . High blood pressure Father   . Melanoma Maternal Grandmother   . Heart attack Maternal Grandfather     Social History   Socioeconomic History  . Marital status: Married    Spouse name: Not on file  . Number of children: Not on file  . Years of education: Not on file  . Highest education level: Not on file  Social Needs  . Financial resource strain: Not on file  . Food insecurity - worry: Not on file  . Food insecurity - inability: Not on file  . Transportation needs - medical: Not on file  . Transportation needs - non-medical: Not on file  Occupational History  . Not on file  Tobacco Use  . Smoking status: Former Smoker    Packs/day: 1.00    Years: 8.00    Pack years: 8.00    Types: Cigarettes    Last attempt to quit: 01/11/1978    Years since quitting: 39.0  . Smokeless tobacco: Never Used  Substance and Sexual Activity  . Alcohol use: Yes    Comment: socially  .  Drug use: No  . Sexual activity: Not on file  Other Topics Concern  . Not on file  Social History Narrative  . Not on file    Environmental and Social history  Lives in a house with a dry environment, a cat located inside the household, carpet in the bedroom, no plastic on the bed, no plastic on the pillow, and no smokers located inside the household.  Objective:   Vitals:   01/06/17 1351  BP: (!) 144/82  Pulse: 64  Resp: 16  Temp: 98.3 F (36.8 C)   Height: 5' 5.75" (167 cm) Weight: 164 lb 6.4 oz (74.6 kg)  Physical Exam  Constitutional: She is  well-developed, well-nourished, and in no distress.  HENT:  Head: Normocephalic. Head is without right periorbital erythema and without left periorbital erythema.  Right Ear: Tympanic membrane, external ear and ear canal normal.  Left Ear: Tympanic membrane, external ear and ear canal normal.  Nose: Nose normal. No mucosal edema or rhinorrhea.  Mouth/Throat: Uvula is midline, oropharynx is clear and moist and mucous membranes are normal. No oropharyngeal exudate.  Eyes: Conjunctivae and lids are normal. Pupils are equal, round, and reactive to light.  Neck: Trachea normal. No tracheal tenderness present. No tracheal deviation present. No thyromegaly present.  Cardiovascular: Normal rate, regular rhythm, S1 normal, S2 normal and normal heart sounds.  No murmur heard. Pulmonary/Chest: Effort normal and breath sounds normal. No stridor. No tachypnea. No respiratory distress. She has no wheezes. She has no rales. She exhibits no tenderness.  Abdominal: Soft. She exhibits no distension and no mass. There is no hepatosplenomegaly. There is no tenderness. There is no rebound and no guarding.  Musculoskeletal: She exhibits no edema or tenderness.  Lymphadenopathy:       Head (right side): No tonsillar adenopathy present.       Head (left side): No tonsillar adenopathy present.    She has no cervical adenopathy.    She has no axillary  adenopathy.  Neurological: She is alert. Gait normal.  Skin: No rash noted. She is not diaphoretic. No erythema. No pallor. Nails show no clubbing.  Psychiatric: Mood and affect normal.    Diagnostics: Allergy skin tests were performed. She did not demonstrate any hypersensitivity against a screening panel of foods.  Spirometry was performed and demonstrated an FEV1 of 1.92 @ 77 % of predicted. FEV1/FVC = 0.74.  Following the administration of nebulized albuterol her FEV1 did not change significantly.  Assessment and Plan:    1. Allergic reaction, initial encounter   2. Hyperventilation syndrome   3. Migraine syndrome   4. Asthma, mild intermittent, well-controlled     1.  Allergen avoidance measures?  2.  Consolidate caffeine and chocolate consumption  3.  Continue Zyrtec 10 mg 1-2 times a day  4.  Continue Proventil HFA or similar every 4-6 hours if needed  5.  Utilize rebreathing technique for hyperventilation  6.  Review all previous blood tests.  Further testing?  7.  Return to clinic in 12 weeks or earlier if problem  Stacy Beasley has a very unusual form of "allergic reaction".  I question whether or not her scenario of headache and lip tingling and chest tightness is a manifestation of migraine headache or possible hyperventilation syndrome associated with migraine headache.  We will start our approach by having her consolidate her caffeine and chocolate consumption with the hope of decreasing intensity and frequency of headaches and I would like for her to try a rebreathing technique during her next episode to see if there is a component of hyperventilation.  I will review all of her previous evaluation and will make a determination about how to proceed pending her response to this approach over the course of the next 12 weeks.  Stacy Prows, MD Allergy / Immunology [facility]

## 2017-01-06 NOTE — Patient Instructions (Addendum)
  1.  Allergen avoidance measures?  2.  Consolidate caffeine and chocolate consumption  3.  Continue Zyrtec 10 mg 1-2 times a day  4.  Continue Proventil HFA or similar every 4-6 hours if needed  5.  Utilize rebreathing technique for hyperventilation  6.  Review all previous blood tests.  Further testing?  7.  Return to clinic in 12 weeks or earlier if problem

## 2017-01-07 ENCOUNTER — Encounter: Payer: Self-pay | Admitting: Allergy and Immunology

## 2017-01-28 DIAGNOSIS — M1711 Unilateral primary osteoarthritis, right knee: Secondary | ICD-10-CM | POA: Diagnosis not present

## 2017-01-28 DIAGNOSIS — Z9889 Other specified postprocedural states: Secondary | ICD-10-CM | POA: Diagnosis not present

## 2017-02-07 ENCOUNTER — Ambulatory Visit
Admission: RE | Admit: 2017-02-07 | Discharge: 2017-02-07 | Disposition: A | Discharge status: Home / Self Care | Payer: PPO | Source: Ambulatory Visit | Attending: Internal Medicine | Admitting: Internal Medicine

## 2017-02-07 DIAGNOSIS — Z1231 Encounter for screening mammogram for malignant neoplasm of breast: Secondary | ICD-10-CM | POA: Diagnosis not present

## 2017-03-02 DIAGNOSIS — J324 Chronic pansinusitis: Secondary | ICD-10-CM | POA: Diagnosis not present

## 2017-03-28 ENCOUNTER — Ambulatory Visit: Payer: PPO | Admitting: Allergy and Immunology

## 2017-05-20 DIAGNOSIS — J0100 Acute maxillary sinusitis, unspecified: Secondary | ICD-10-CM | POA: Diagnosis not present

## 2017-05-24 DIAGNOSIS — R0602 Shortness of breath: Secondary | ICD-10-CM | POA: Diagnosis not present

## 2017-05-24 DIAGNOSIS — I10 Essential (primary) hypertension: Secondary | ICD-10-CM | POA: Diagnosis not present

## 2017-05-24 DIAGNOSIS — R05 Cough: Secondary | ICD-10-CM | POA: Diagnosis not present

## 2017-09-15 DIAGNOSIS — M79645 Pain in left finger(s): Secondary | ICD-10-CM | POA: Diagnosis not present

## 2017-09-15 DIAGNOSIS — S60022A Contusion of left index finger without damage to nail, initial encounter: Secondary | ICD-10-CM | POA: Diagnosis not present

## 2017-10-18 DIAGNOSIS — M19011 Primary osteoarthritis, right shoulder: Secondary | ICD-10-CM | POA: Diagnosis not present

## 2017-10-18 DIAGNOSIS — M7541 Impingement syndrome of right shoulder: Secondary | ICD-10-CM | POA: Diagnosis not present

## 2018-01-12 ENCOUNTER — Other Ambulatory Visit: Payer: Self-pay | Admitting: Internal Medicine

## 2018-01-12 DIAGNOSIS — Z1231 Encounter for screening mammogram for malignant neoplasm of breast: Secondary | ICD-10-CM

## 2018-01-17 DIAGNOSIS — L821 Other seborrheic keratosis: Secondary | ICD-10-CM | POA: Diagnosis not present

## 2018-02-06 DIAGNOSIS — I10 Essential (primary) hypertension: Secondary | ICD-10-CM | POA: Diagnosis not present

## 2018-02-07 DIAGNOSIS — E049 Nontoxic goiter, unspecified: Secondary | ICD-10-CM | POA: Diagnosis not present

## 2018-02-10 ENCOUNTER — Ambulatory Visit
Admission: RE | Admit: 2018-02-10 | Discharge: 2018-02-10 | Disposition: A | Discharge status: Home / Self Care | Payer: PPO | Source: Ambulatory Visit | Attending: Internal Medicine | Admitting: Internal Medicine

## 2018-02-10 DIAGNOSIS — E042 Nontoxic multinodular goiter: Secondary | ICD-10-CM | POA: Diagnosis not present

## 2018-02-10 DIAGNOSIS — E049 Nontoxic goiter, unspecified: Secondary | ICD-10-CM | POA: Diagnosis not present

## 2018-02-10 DIAGNOSIS — Z1231 Encounter for screening mammogram for malignant neoplasm of breast: Secondary | ICD-10-CM | POA: Diagnosis not present

## 2018-02-16 DIAGNOSIS — R599 Enlarged lymph nodes, unspecified: Secondary | ICD-10-CM | POA: Diagnosis not present

## 2018-02-23 DIAGNOSIS — K219 Gastro-esophageal reflux disease without esophagitis: Secondary | ICD-10-CM | POA: Diagnosis not present

## 2018-02-23 DIAGNOSIS — J3089 Other allergic rhinitis: Secondary | ICD-10-CM | POA: Diagnosis not present

## 2018-02-23 DIAGNOSIS — M150 Primary generalized (osteo)arthritis: Secondary | ICD-10-CM | POA: Diagnosis not present

## 2018-02-23 DIAGNOSIS — F419 Anxiety disorder, unspecified: Secondary | ICD-10-CM | POA: Diagnosis not present

## 2018-02-23 DIAGNOSIS — L508 Other urticaria: Secondary | ICD-10-CM | POA: Diagnosis not present

## 2018-02-23 DIAGNOSIS — E049 Nontoxic goiter, unspecified: Secondary | ICD-10-CM | POA: Diagnosis not present

## 2018-02-23 DIAGNOSIS — I10 Essential (primary) hypertension: Secondary | ICD-10-CM | POA: Diagnosis not present

## 2018-02-23 DIAGNOSIS — R5381 Other malaise: Secondary | ICD-10-CM | POA: Diagnosis not present

## 2018-02-23 DIAGNOSIS — I7090 Unspecified atherosclerosis: Secondary | ICD-10-CM | POA: Diagnosis not present

## 2018-02-23 DIAGNOSIS — R590 Localized enlarged lymph nodes: Secondary | ICD-10-CM | POA: Diagnosis not present

## 2018-02-23 DIAGNOSIS — R5383 Other fatigue: Secondary | ICD-10-CM | POA: Diagnosis not present

## 2018-02-23 DIAGNOSIS — E782 Mixed hyperlipidemia: Secondary | ICD-10-CM | POA: Diagnosis not present

## 2018-03-24 DIAGNOSIS — E041 Nontoxic single thyroid nodule: Secondary | ICD-10-CM | POA: Diagnosis not present

## 2018-03-24 DIAGNOSIS — R599 Enlarged lymph nodes, unspecified: Secondary | ICD-10-CM | POA: Diagnosis not present

## 2018-03-31 DIAGNOSIS — K118 Other diseases of salivary glands: Secondary | ICD-10-CM | POA: Diagnosis not present

## 2018-03-31 DIAGNOSIS — R221 Localized swelling, mass and lump, neck: Secondary | ICD-10-CM | POA: Diagnosis not present

## 2018-05-01 DIAGNOSIS — D485 Neoplasm of uncertain behavior of skin: Secondary | ICD-10-CM | POA: Diagnosis not present

## 2018-05-16 DIAGNOSIS — S93492A Sprain of other ligament of left ankle, initial encounter: Secondary | ICD-10-CM | POA: Diagnosis not present

## 2018-05-16 DIAGNOSIS — S99912A Unspecified injury of left ankle, initial encounter: Secondary | ICD-10-CM | POA: Diagnosis not present

## 2018-10-26 DIAGNOSIS — E782 Mixed hyperlipidemia: Secondary | ICD-10-CM | POA: Diagnosis not present

## 2018-10-26 DIAGNOSIS — Z23 Encounter for immunization: Secondary | ICD-10-CM | POA: Diagnosis not present

## 2018-10-26 DIAGNOSIS — R7309 Other abnormal glucose: Secondary | ICD-10-CM | POA: Diagnosis not present

## 2018-10-26 DIAGNOSIS — Z79899 Other long term (current) drug therapy: Secondary | ICD-10-CM | POA: Diagnosis not present

## 2018-10-26 DIAGNOSIS — E049 Nontoxic goiter, unspecified: Secondary | ICD-10-CM | POA: Diagnosis not present

## 2018-10-26 DIAGNOSIS — I10 Essential (primary) hypertension: Secondary | ICD-10-CM | POA: Diagnosis not present

## 2018-10-26 DIAGNOSIS — R5383 Other fatigue: Secondary | ICD-10-CM | POA: Diagnosis not present

## 2018-11-07 DIAGNOSIS — M8949 Other hypertrophic osteoarthropathy, multiple sites: Secondary | ICD-10-CM | POA: Diagnosis not present

## 2018-11-07 DIAGNOSIS — Z789 Other specified health status: Secondary | ICD-10-CM | POA: Diagnosis not present

## 2018-11-07 DIAGNOSIS — I7090 Unspecified atherosclerosis: Secondary | ICD-10-CM | POA: Diagnosis not present

## 2018-11-07 DIAGNOSIS — Z87891 Personal history of nicotine dependence: Secondary | ICD-10-CM | POA: Diagnosis not present

## 2018-11-07 DIAGNOSIS — J3089 Other allergic rhinitis: Secondary | ICD-10-CM | POA: Diagnosis not present

## 2018-11-07 DIAGNOSIS — R5383 Other fatigue: Secondary | ICD-10-CM | POA: Diagnosis not present

## 2018-11-07 DIAGNOSIS — R5381 Other malaise: Secondary | ICD-10-CM | POA: Diagnosis not present

## 2018-11-07 DIAGNOSIS — K219 Gastro-esophageal reflux disease without esophagitis: Secondary | ICD-10-CM | POA: Diagnosis not present

## 2018-11-07 DIAGNOSIS — E782 Mixed hyperlipidemia: Secondary | ICD-10-CM | POA: Diagnosis not present

## 2018-11-07 DIAGNOSIS — D801 Nonfamilial hypogammaglobulinemia: Secondary | ICD-10-CM | POA: Diagnosis not present

## 2018-11-07 DIAGNOSIS — F419 Anxiety disorder, unspecified: Secondary | ICD-10-CM | POA: Diagnosis not present

## 2018-11-07 DIAGNOSIS — I10 Essential (primary) hypertension: Secondary | ICD-10-CM | POA: Diagnosis not present

## 2019-01-08 ENCOUNTER — Other Ambulatory Visit: Payer: Self-pay | Admitting: Internal Medicine

## 2019-01-08 DIAGNOSIS — Z1231 Encounter for screening mammogram for malignant neoplasm of breast: Secondary | ICD-10-CM

## 2019-02-19 ENCOUNTER — Ambulatory Visit: Payer: PPO

## 2019-03-09 ENCOUNTER — Ambulatory Visit
Admission: RE | Admit: 2019-03-09 | Discharge: 2019-03-09 | Disposition: A | Discharge status: Home / Self Care | Payer: PPO | Source: Ambulatory Visit | Attending: Internal Medicine | Admitting: Internal Medicine

## 2019-03-09 ENCOUNTER — Other Ambulatory Visit: Payer: Self-pay

## 2019-03-09 DIAGNOSIS — Z1231 Encounter for screening mammogram for malignant neoplasm of breast: Secondary | ICD-10-CM

## 2019-03-12 ENCOUNTER — Other Ambulatory Visit: Payer: Self-pay | Admitting: Internal Medicine

## 2019-03-12 DIAGNOSIS — N644 Mastodynia: Secondary | ICD-10-CM

## 2019-04-02 ENCOUNTER — Other Ambulatory Visit: Payer: Self-pay

## 2019-04-02 ENCOUNTER — Ambulatory Visit: Payer: PPO

## 2019-04-02 ENCOUNTER — Ambulatory Visit
Admission: RE | Admit: 2019-04-02 | Discharge: 2019-04-02 | Disposition: A | Discharge status: Home / Self Care | Payer: PPO | Source: Ambulatory Visit | Attending: Internal Medicine | Admitting: Internal Medicine

## 2019-04-02 DIAGNOSIS — N644 Mastodynia: Secondary | ICD-10-CM

## 2019-04-02 DIAGNOSIS — R922 Inconclusive mammogram: Secondary | ICD-10-CM | POA: Diagnosis not present

## 2019-05-29 DIAGNOSIS — M1711 Unilateral primary osteoarthritis, right knee: Secondary | ICD-10-CM | POA: Diagnosis not present

## 2019-05-29 DIAGNOSIS — M25561 Pain in right knee: Secondary | ICD-10-CM | POA: Diagnosis not present

## 2019-06-14 DIAGNOSIS — M65312 Trigger thumb, left thumb: Secondary | ICD-10-CM | POA: Diagnosis not present

## 2019-06-14 DIAGNOSIS — M1812 Unilateral primary osteoarthritis of first carpometacarpal joint, left hand: Secondary | ICD-10-CM | POA: Diagnosis not present

## 2019-06-14 DIAGNOSIS — G7081 Lambert-Eaton syndrome in disease classified elsewhere: Secondary | ICD-10-CM | POA: Diagnosis not present

## 2019-07-17 DIAGNOSIS — J3089 Other allergic rhinitis: Secondary | ICD-10-CM | POA: Diagnosis not present

## 2019-07-17 DIAGNOSIS — E049 Nontoxic goiter, unspecified: Secondary | ICD-10-CM | POA: Diagnosis not present

## 2019-07-17 DIAGNOSIS — R5381 Other malaise: Secondary | ICD-10-CM | POA: Diagnosis not present

## 2019-07-17 DIAGNOSIS — Z789 Other specified health status: Secondary | ICD-10-CM | POA: Diagnosis not present

## 2019-07-17 DIAGNOSIS — K219 Gastro-esophageal reflux disease without esophagitis: Secondary | ICD-10-CM | POA: Diagnosis not present

## 2019-07-17 DIAGNOSIS — D801 Nonfamilial hypogammaglobulinemia: Secondary | ICD-10-CM | POA: Diagnosis not present

## 2019-07-17 DIAGNOSIS — R5383 Other fatigue: Secondary | ICD-10-CM | POA: Diagnosis not present

## 2019-07-17 DIAGNOSIS — E782 Mixed hyperlipidemia: Secondary | ICD-10-CM | POA: Diagnosis not present

## 2019-07-17 DIAGNOSIS — L508 Other urticaria: Secondary | ICD-10-CM | POA: Diagnosis not present

## 2019-07-17 DIAGNOSIS — F419 Anxiety disorder, unspecified: Secondary | ICD-10-CM | POA: Diagnosis not present

## 2019-07-17 DIAGNOSIS — I10 Essential (primary) hypertension: Secondary | ICD-10-CM | POA: Diagnosis not present

## 2019-07-17 DIAGNOSIS — M8949 Other hypertrophic osteoarthropathy, multiple sites: Secondary | ICD-10-CM | POA: Diagnosis not present

## 2019-07-17 DIAGNOSIS — I7090 Unspecified atherosclerosis: Secondary | ICD-10-CM | POA: Diagnosis not present

## 2019-07-17 DIAGNOSIS — Z0000 Encounter for general adult medical examination without abnormal findings: Secondary | ICD-10-CM | POA: Diagnosis not present

## 2019-07-23 DIAGNOSIS — R001 Bradycardia, unspecified: Secondary | ICD-10-CM | POA: Diagnosis not present

## 2019-07-31 DIAGNOSIS — D485 Neoplasm of uncertain behavior of skin: Secondary | ICD-10-CM | POA: Diagnosis not present

## 2019-08-02 DIAGNOSIS — E042 Nontoxic multinodular goiter: Secondary | ICD-10-CM | POA: Diagnosis not present

## 2019-08-02 DIAGNOSIS — E049 Nontoxic goiter, unspecified: Secondary | ICD-10-CM | POA: Diagnosis not present

## 2019-11-05 DIAGNOSIS — Z23 Encounter for immunization: Secondary | ICD-10-CM | POA: Diagnosis not present

## 2020-01-07 ENCOUNTER — Other Ambulatory Visit: Payer: Self-pay | Admitting: Family Medicine

## 2020-01-07 ENCOUNTER — Other Ambulatory Visit: Payer: Self-pay | Admitting: Internal Medicine

## 2020-01-07 DIAGNOSIS — N6320 Unspecified lump in the left breast, unspecified quadrant: Secondary | ICD-10-CM

## 2020-01-16 DIAGNOSIS — N6321 Unspecified lump in the left breast, upper outer quadrant: Secondary | ICD-10-CM | POA: Diagnosis not present

## 2020-01-16 DIAGNOSIS — R922 Inconclusive mammogram: Secondary | ICD-10-CM | POA: Diagnosis not present

## 2020-01-23 DIAGNOSIS — Z789 Other specified health status: Secondary | ICD-10-CM | POA: Diagnosis not present

## 2020-01-23 DIAGNOSIS — G8929 Other chronic pain: Secondary | ICD-10-CM | POA: Diagnosis not present

## 2020-01-23 DIAGNOSIS — Z818 Family history of other mental and behavioral disorders: Secondary | ICD-10-CM | POA: Diagnosis not present

## 2020-01-23 DIAGNOSIS — Z87891 Personal history of nicotine dependence: Secondary | ICD-10-CM | POA: Diagnosis not present

## 2020-01-23 DIAGNOSIS — E782 Mixed hyperlipidemia: Secondary | ICD-10-CM | POA: Diagnosis not present

## 2020-01-23 DIAGNOSIS — Z0000 Encounter for general adult medical examination without abnormal findings: Secondary | ICD-10-CM | POA: Diagnosis not present

## 2020-01-23 DIAGNOSIS — Z13228 Encounter for screening for other metabolic disorders: Secondary | ICD-10-CM | POA: Diagnosis not present

## 2020-01-23 DIAGNOSIS — F419 Anxiety disorder, unspecified: Secondary | ICD-10-CM | POA: Diagnosis not present

## 2020-01-23 DIAGNOSIS — Z79899 Other long term (current) drug therapy: Secondary | ICD-10-CM | POA: Diagnosis not present

## 2020-01-23 DIAGNOSIS — R5381 Other malaise: Secondary | ICD-10-CM | POA: Diagnosis not present

## 2020-01-23 DIAGNOSIS — Z0001 Encounter for general adult medical examination with abnormal findings: Secondary | ICD-10-CM | POA: Diagnosis not present

## 2020-01-23 DIAGNOSIS — L659 Nonscarring hair loss, unspecified: Secondary | ICD-10-CM | POA: Diagnosis not present

## 2020-01-23 DIAGNOSIS — Z836 Family history of other diseases of the respiratory system: Secondary | ICD-10-CM | POA: Diagnosis not present

## 2020-01-23 DIAGNOSIS — L508 Other urticaria: Secondary | ICD-10-CM | POA: Diagnosis not present

## 2020-01-23 DIAGNOSIS — M8949 Other hypertrophic osteoarthropathy, multiple sites: Secondary | ICD-10-CM | POA: Diagnosis not present

## 2020-01-23 DIAGNOSIS — Z841 Family history of disorders of kidney and ureter: Secondary | ICD-10-CM | POA: Diagnosis not present

## 2020-01-23 DIAGNOSIS — Z8249 Family history of ischemic heart disease and other diseases of the circulatory system: Secondary | ICD-10-CM | POA: Diagnosis not present

## 2020-01-23 DIAGNOSIS — R5383 Other fatigue: Secondary | ICD-10-CM | POA: Diagnosis not present

## 2020-01-23 DIAGNOSIS — K219 Gastro-esophageal reflux disease without esophagitis: Secondary | ICD-10-CM | POA: Diagnosis not present

## 2020-01-23 DIAGNOSIS — I7090 Unspecified atherosclerosis: Secondary | ICD-10-CM | POA: Diagnosis not present

## 2020-01-23 DIAGNOSIS — J3089 Other allergic rhinitis: Secondary | ICD-10-CM | POA: Diagnosis not present

## 2020-01-23 DIAGNOSIS — M25561 Pain in right knee: Secondary | ICD-10-CM | POA: Diagnosis not present

## 2020-01-23 DIAGNOSIS — I10 Essential (primary) hypertension: Secondary | ICD-10-CM | POA: Diagnosis not present

## 2020-01-23 DIAGNOSIS — Z13220 Encounter for screening for lipoid disorders: Secondary | ICD-10-CM | POA: Diagnosis not present

## 2020-01-23 DIAGNOSIS — Z8051 Family history of malignant neoplasm of kidney: Secondary | ICD-10-CM | POA: Diagnosis not present

## 2020-01-23 DIAGNOSIS — D801 Nonfamilial hypogammaglobulinemia: Secondary | ICD-10-CM | POA: Diagnosis not present

## 2020-02-13 ENCOUNTER — Other Ambulatory Visit: Payer: PPO

## 2020-02-27 DIAGNOSIS — R5383 Other fatigue: Secondary | ICD-10-CM | POA: Diagnosis not present

## 2020-02-27 DIAGNOSIS — Z79899 Other long term (current) drug therapy: Secondary | ICD-10-CM | POA: Diagnosis not present

## 2020-02-27 DIAGNOSIS — Z01818 Encounter for other preprocedural examination: Secondary | ICD-10-CM | POA: Diagnosis not present

## 2020-02-27 DIAGNOSIS — I7090 Unspecified atherosclerosis: Secondary | ICD-10-CM | POA: Diagnosis not present

## 2020-02-27 DIAGNOSIS — I10 Essential (primary) hypertension: Secondary | ICD-10-CM | POA: Diagnosis not present

## 2020-02-27 DIAGNOSIS — R5381 Other malaise: Secondary | ICD-10-CM | POA: Diagnosis not present

## 2020-02-27 DIAGNOSIS — M25561 Pain in right knee: Secondary | ICD-10-CM | POA: Diagnosis not present

## 2020-02-27 DIAGNOSIS — R001 Bradycardia, unspecified: Secondary | ICD-10-CM | POA: Diagnosis not present

## 2020-02-27 DIAGNOSIS — Z01812 Encounter for preprocedural laboratory examination: Secondary | ICD-10-CM | POA: Diagnosis not present

## 2020-02-27 DIAGNOSIS — G8929 Other chronic pain: Secondary | ICD-10-CM | POA: Diagnosis not present

## 2020-03-11 DIAGNOSIS — R399 Unspecified symptoms and signs involving the genitourinary system: Secondary | ICD-10-CM | POA: Diagnosis not present

## 2020-03-11 DIAGNOSIS — N3000 Acute cystitis without hematuria: Secondary | ICD-10-CM | POA: Diagnosis not present

## 2020-03-12 ENCOUNTER — Other Ambulatory Visit: Payer: PPO

## 2020-03-18 DIAGNOSIS — Z87442 Personal history of urinary calculi: Secondary | ICD-10-CM | POA: Diagnosis not present

## 2020-03-18 DIAGNOSIS — R109 Unspecified abdominal pain: Secondary | ICD-10-CM | POA: Diagnosis not present

## 2020-03-18 DIAGNOSIS — K4090 Unilateral inguinal hernia, without obstruction or gangrene, not specified as recurrent: Secondary | ICD-10-CM | POA: Diagnosis not present

## 2020-03-18 DIAGNOSIS — K6389 Other specified diseases of intestine: Secondary | ICD-10-CM | POA: Diagnosis not present

## 2020-03-18 DIAGNOSIS — K3189 Other diseases of stomach and duodenum: Secondary | ICD-10-CM | POA: Diagnosis not present

## 2020-03-21 DIAGNOSIS — Z1231 Encounter for screening mammogram for malignant neoplasm of breast: Secondary | ICD-10-CM | POA: Diagnosis not present

## 2020-04-16 DIAGNOSIS — I639 Cerebral infarction, unspecified: Secondary | ICD-10-CM | POA: Diagnosis not present

## 2020-04-16 DIAGNOSIS — R0789 Other chest pain: Secondary | ICD-10-CM | POA: Diagnosis not present

## 2020-04-16 DIAGNOSIS — E7800 Pure hypercholesterolemia, unspecified: Secondary | ICD-10-CM | POA: Diagnosis not present

## 2020-04-16 DIAGNOSIS — Z881 Allergy status to other antibiotic agents status: Secondary | ICD-10-CM | POA: Diagnosis not present

## 2020-04-16 DIAGNOSIS — I6782 Cerebral ischemia: Secondary | ICD-10-CM | POA: Diagnosis not present

## 2020-04-16 DIAGNOSIS — R030 Elevated blood-pressure reading, without diagnosis of hypertension: Secondary | ICD-10-CM | POA: Diagnosis not present

## 2020-04-16 DIAGNOSIS — Z7982 Long term (current) use of aspirin: Secondary | ICD-10-CM | POA: Diagnosis not present

## 2020-04-16 DIAGNOSIS — R413 Other amnesia: Secondary | ICD-10-CM | POA: Diagnosis not present

## 2020-04-16 DIAGNOSIS — Z87891 Personal history of nicotine dependence: Secondary | ICD-10-CM | POA: Diagnosis not present

## 2020-04-16 DIAGNOSIS — Z79899 Other long term (current) drug therapy: Secondary | ICD-10-CM | POA: Diagnosis not present

## 2020-04-16 DIAGNOSIS — I248 Other forms of acute ischemic heart disease: Secondary | ICD-10-CM | POA: Diagnosis not present

## 2020-04-16 DIAGNOSIS — I160 Hypertensive urgency: Secondary | ICD-10-CM | POA: Diagnosis not present

## 2020-04-16 DIAGNOSIS — I361 Nonrheumatic tricuspid (valve) insufficiency: Secondary | ICD-10-CM | POA: Diagnosis not present

## 2020-04-16 DIAGNOSIS — Z888 Allergy status to other drugs, medicaments and biological substances status: Secondary | ICD-10-CM | POA: Diagnosis not present

## 2020-04-16 DIAGNOSIS — R410 Disorientation, unspecified: Secondary | ICD-10-CM | POA: Diagnosis not present

## 2020-04-16 DIAGNOSIS — G454 Transient global amnesia: Secondary | ICD-10-CM | POA: Diagnosis not present

## 2020-04-16 DIAGNOSIS — K219 Gastro-esophageal reflux disease without esophagitis: Secondary | ICD-10-CM | POA: Diagnosis not present

## 2020-04-16 DIAGNOSIS — Z880 Allergy status to penicillin: Secondary | ICD-10-CM | POA: Diagnosis not present

## 2020-04-16 DIAGNOSIS — R079 Chest pain, unspecified: Secondary | ICD-10-CM | POA: Diagnosis not present

## 2020-04-16 DIAGNOSIS — R778 Other specified abnormalities of plasma proteins: Secondary | ICD-10-CM | POA: Diagnosis not present

## 2020-04-16 DIAGNOSIS — I10 Essential (primary) hypertension: Secondary | ICD-10-CM | POA: Diagnosis not present

## 2020-04-17 DIAGNOSIS — R079 Chest pain, unspecified: Secondary | ICD-10-CM | POA: Diagnosis not present

## 2020-04-17 DIAGNOSIS — I10 Essential (primary) hypertension: Secondary | ICD-10-CM | POA: Diagnosis not present

## 2020-04-17 DIAGNOSIS — R778 Other specified abnormalities of plasma proteins: Secondary | ICD-10-CM | POA: Diagnosis not present

## 2020-04-18 ENCOUNTER — Other Ambulatory Visit (INDEPENDENT_AMBULATORY_CARE_PROVIDER_SITE_OTHER): Payer: PPO

## 2020-04-18 ENCOUNTER — Other Ambulatory Visit: Payer: Self-pay

## 2020-04-18 DIAGNOSIS — I248 Other forms of acute ischemic heart disease: Secondary | ICD-10-CM | POA: Diagnosis not present

## 2020-04-18 DIAGNOSIS — R778 Other specified abnormalities of plasma proteins: Secondary | ICD-10-CM | POA: Diagnosis not present

## 2020-04-18 DIAGNOSIS — I10 Essential (primary) hypertension: Secondary | ICD-10-CM | POA: Diagnosis not present

## 2020-04-18 DIAGNOSIS — R079 Chest pain, unspecified: Secondary | ICD-10-CM | POA: Diagnosis not present

## 2020-04-18 MED ORDER — METOPROLOL TARTRATE 100 MG PO TABS: 100.0000 mg | ORAL_TABLET | Freq: Once | ORAL | 0 refills | Status: DC

## 2020-04-21 ENCOUNTER — Telehealth: Payer: Self-pay | Admitting: Cardiology

## 2020-04-21 MED ORDER — METOPROLOL TARTRATE 25 MG PO TABS: 25.0000 mg | ORAL_TABLET | Freq: Every day | ORAL | 1 refills | Status: DC

## 2020-04-21 NOTE — Telephone Encounter (Signed)
Patient notified of the following changes per Dr. Bettina Gavia. Rx with new instruction sent and updated on file

## 2020-04-21 NOTE — Telephone Encounter (Signed)
Lets reduce to once daily morning 25 mg

## 2020-04-21 NOTE — Telephone Encounter (Signed)
Pt c/o medication issue:  1. Name of Medication:  metoprolol tartrate (LOPRESSOR) 100 MG tablet [[id number]] ENDED   2. How are you currently taking this medication (dosage and times per day)? 25 mg 2 time daily   3. Are you having a reaction (difficulty breathing--STAT)? No   4. What is your medication issue? Pt stated that this med has been helping her bp but her pulse is dropping down into the low 40s. She would like to run this my Dr Bettina Gavia to see if this ok or it needs to be adjusted

## 2020-04-23 DIAGNOSIS — I248 Other forms of acute ischemic heart disease: Secondary | ICD-10-CM | POA: Diagnosis not present

## 2020-04-25 ENCOUNTER — Telehealth: Payer: Self-pay

## 2020-04-25 NOTE — Telephone Encounter (Signed)
Spoke to the patient just now and she let me know that her hands/feet are "puffy" and she is wanting to know if she can start back taking her hydrochlorothiazide even if it is only a couple of times a week. This was discontinues at [facility]  Her heart rate has remained in the upper 40's-low 50's range even since switching her metoprolol to just 25 mg once per day. The patient is now taking 12.5 mg twice daily as she said she can tolerate this better.   She is already scheduled for a follow up with Dr. Bettina Gavia.

## 2020-04-25 NOTE — Telephone Encounter (Signed)
Yes she can take hydrochlorothiazide

## 2020-04-25 NOTE — Telephone Encounter (Signed)
Spoke to the patient just now and let her know Dr. Joya Gaskins recommendations. She verbalizes understanding and thanks me for the call back.

## 2020-04-30 DIAGNOSIS — G8929 Other chronic pain: Secondary | ICD-10-CM | POA: Diagnosis not present

## 2020-04-30 DIAGNOSIS — M25561 Pain in right knee: Secondary | ICD-10-CM | POA: Diagnosis not present

## 2020-04-30 DIAGNOSIS — I248 Other forms of acute ischemic heart disease: Secondary | ICD-10-CM | POA: Diagnosis not present

## 2020-04-30 DIAGNOSIS — R5381 Other malaise: Secondary | ICD-10-CM | POA: Diagnosis not present

## 2020-04-30 DIAGNOSIS — I10 Essential (primary) hypertension: Secondary | ICD-10-CM | POA: Diagnosis not present

## 2020-04-30 DIAGNOSIS — I7090 Unspecified atherosclerosis: Secondary | ICD-10-CM | POA: Diagnosis not present

## 2020-04-30 DIAGNOSIS — F419 Anxiety disorder, unspecified: Secondary | ICD-10-CM | POA: Diagnosis not present

## 2020-04-30 DIAGNOSIS — R5383 Other fatigue: Secondary | ICD-10-CM | POA: Diagnosis not present

## 2020-05-01 ENCOUNTER — Telehealth (HOSPITAL_COMMUNITY): Payer: Self-pay | Admitting: *Deleted

## 2020-05-01 NOTE — Telephone Encounter (Signed)
Attempted to call patient regarding upcoming cardiac CT appointment. °Left message on voicemail with name and callback number ° °Conard Alvira RN Navigator Cardiac Imaging °[facility] °[phone] Office °[phone] Cell ° °

## 2020-05-01 NOTE — Telephone Encounter (Signed)
Pt returning call regarding upcoming cardiac imaging study; pt verbalizes understanding of appt date/time, parking situation and where to check in, pre-test NPO status and medications ordered, and verified current allergies; name and call back number provided for further questions should they arise  Gordy Clement RN Navigator Cardiac Imaging [facility] [phone] office [phone] cell  Pt to take her daily 12.5 metoprolol tartrate 2 hours prior to test.  Pt states she will bring a copy of her labs to appointment.

## 2020-05-05 ENCOUNTER — Ambulatory Visit (HOSPITAL_COMMUNITY)
Admission: RE | Admit: 2020-05-05 | Discharge: 2020-05-05 | Disposition: A | Discharge status: Home / Self Care | Payer: PPO | Source: Ambulatory Visit | Attending: Cardiology | Admitting: Cardiology

## 2020-05-05 ENCOUNTER — Other Ambulatory Visit: Payer: Self-pay

## 2020-05-05 DIAGNOSIS — I248 Other forms of acute ischemic heart disease: Secondary | ICD-10-CM

## 2020-05-05 DIAGNOSIS — R079 Chest pain, unspecified: Secondary | ICD-10-CM | POA: Insufficient documentation

## 2020-05-05 DIAGNOSIS — I700 Atherosclerosis of aorta: Secondary | ICD-10-CM | POA: Insufficient documentation

## 2020-05-05 MED ORDER — NITROGLYCERIN 0.4 MG SL SUBL: 0.8000 mg | SUBLINGUAL_TABLET | Freq: Once | SUBLINGUAL | Status: AC | PRN

## 2020-05-05 MED ORDER — NITROGLYCERIN 0.4 MG SL SUBL
SUBLINGUAL_TABLET | SUBLINGUAL | Status: AC
  Administered 2020-05-05: 0.8 mg via SUBLINGUAL
  Filled 2020-05-05: qty 2

## 2020-05-05 MED ORDER — IOHEXOL 350 MG/ML SOLN
95.0000 mL | Freq: Once | INTRAVENOUS | Status: AC | PRN
  Administered 2020-05-05: 95 mL via INTRAVENOUS

## 2020-05-05 NOTE — Progress Notes (Signed)
   05/05/20 1321  Vital Signs  Pulse Rate 61  Pulse Rate Source Monitor  BP 121/68  MAP (mmHg) 81  BP Location Right Arm  BP Method Automatic  Patient Position (if appropriate) Sitting  MEWS Score  MEWS Temp 0  MEWS Systolic 0  MEWS Pulse 0  MEWS RR 0  MEWS LOC 0  MEWS Score 0  MEWS Score Color Green    Patient cardiac CT completed. PIV removed. Ambulatory to waiting room to meet husband. Discharged in stable condition.

## 2020-05-06 ENCOUNTER — Telehealth: Payer: Self-pay | Admitting: Cardiology

## 2020-05-06 NOTE — Telephone Encounter (Signed)
Stacy Beasley is returning Candice's call in regards to her CT results. She requested a VM with the results be left if she does not answer when calling back. States she will callback if she has questions. Please advise.

## 2020-05-06 NOTE — Telephone Encounter (Signed)
Per DPR detailed message left on VM with lab results as reviewed by Dr. Harriet Masson.

## 2020-05-14 DIAGNOSIS — Z9889 Other specified postprocedural states: Secondary | ICD-10-CM | POA: Insufficient documentation

## 2020-05-14 DIAGNOSIS — M1990 Unspecified osteoarthritis, unspecified site: Secondary | ICD-10-CM | POA: Insufficient documentation

## 2020-05-14 DIAGNOSIS — E049 Nontoxic goiter, unspecified: Secondary | ICD-10-CM | POA: Insufficient documentation

## 2020-05-14 DIAGNOSIS — J302 Other seasonal allergic rhinitis: Secondary | ICD-10-CM | POA: Insufficient documentation

## 2020-05-14 DIAGNOSIS — R112 Nausea with vomiting, unspecified: Secondary | ICD-10-CM | POA: Insufficient documentation

## 2020-05-14 DIAGNOSIS — J45909 Unspecified asthma, uncomplicated: Secondary | ICD-10-CM | POA: Insufficient documentation

## 2020-05-14 DIAGNOSIS — I10 Essential (primary) hypertension: Secondary | ICD-10-CM | POA: Insufficient documentation

## 2020-05-19 ENCOUNTER — Telehealth: Payer: Self-pay

## 2020-05-19 NOTE — Telephone Encounter (Signed)
-----   Message from Stacy Priest, MD sent at 05/17/2020 12:44 PM EDT ----- Her event monitor is unremarkable.  For her considered to be normal.  Unsure if she received a message that her cardiac CTA was normal.

## 2020-05-19 NOTE — Telephone Encounter (Signed)
Left message on patients voicemail to please return our call.   

## 2020-05-19 NOTE — Telephone Encounter (Signed)
Spoke with patient regarding results and recommendation.  Patient verbalizes understanding and is agreeable to plan of care. Advised patient to call back with any issues or concerns.  

## 2020-05-19 NOTE — Telephone Encounter (Signed)
-----   Message from Brian J Munley, MD sent at 05/17/2020 12:44 PM EDT ----- Her event monitor is unremarkable.  For her considered to be normal.  Unsure if she received a message that her cardiac CTA was normal. 

## 2020-05-28 ENCOUNTER — Encounter: Payer: Self-pay | Admitting: Cardiology

## 2020-05-28 ENCOUNTER — Other Ambulatory Visit: Payer: Self-pay

## 2020-05-28 ENCOUNTER — Ambulatory Visit: Payer: PPO | Admitting: Cardiology

## 2020-05-28 VITALS — BP 160/82 | HR 65 | Ht 65.75 in | Wt 159.0 lb

## 2020-05-28 DIAGNOSIS — G454 Transient global amnesia: Secondary | ICD-10-CM

## 2020-05-28 DIAGNOSIS — I248 Other forms of acute ischemic heart disease: Secondary | ICD-10-CM

## 2020-05-28 DIAGNOSIS — I2489 Other forms of acute ischemic heart disease: Secondary | ICD-10-CM

## 2020-05-28 DIAGNOSIS — I10 Essential (primary) hypertension: Secondary | ICD-10-CM

## 2020-05-28 MED ORDER — PRAVASTATIN SODIUM 20 MG PO TABS
20.0000 mg | ORAL_TABLET | ORAL | 3 refills | Status: AC
Start: 2020-05-29 — End: ?

## 2020-05-28 MED ORDER — LOSARTAN POTASSIUM 50 MG PO TABS
50.0000 mg | ORAL_TABLET | Freq: Every day | ORAL | 3 refills | Status: DC | PRN
Start: 2020-05-28 — End: 2020-06-16

## 2020-05-28 NOTE — Progress Notes (Signed)
She was seen by me at [facility] last 04/18/2020 when she presented with hypertensive urgency elevated troponin without ischemic EKG changes or segmental left ventricular dysfunction echocardiogram and a neurologic event felt to be transient global amnesia.  She was felt to have demand ischemia with a hypertensive urgency BP was well controlled in hospital and she was set up for an ambulatory heart rhythm monitor through my office.  Hemoglobin was 12.0 creatinine 36.6 potassium 3.5 creatinine 0.8.  Echocardiogram performed at [facility] was normal without evidence of shunt and agitated contrast saline injection both MRI and MRA brain and cervical vessels normal   Cardiology Office Note:    Date:  05/28/2020   ID:  Stacy Beasley, DOB [birth year], MRN [id number]  PCP:  Mayer Camel, NP  Cardiologist:  Shirlee More, MD    Referring MD: Raina Mina., MD    ASSESSMENT:    1. Demand ischemia ([place])   2. Primary hypertension   3. Transient global amnesia    PLAN:    In order of problems listed above:  1. She had an unusual episode of hypertensive urgency with demand ischemia transient global amnesia.  Fortunately her cardiac CTA is normal calcium score 0 no CAD which she does have aortic atherosclerosis not continue her low intensity statin with a neurologic event.  Her blood pressure is now well controlled on low-dose thiazide diuretic I gave her a prescription for an ARB to take as needed.  I will plan to see her back in my office as needed. 2. She asked my opinion about orthopedic surgery total knee arthroplasty from my perspective she is optimized.   Next appointment: As needed   Medication Adjustments/Labs and Tests Ordered: Current medicines are reviewed at length with the patient today.  Concerns regarding medicines are outlined above.  No orders of the defined types were placed in this encounter.  No orders of the defined types were placed in this  encounter.   Chief Complaint  Patient presents with  . Hospitalization Follow-up    History of Present Illness:    Stacy Beasley is a 70 y.o. female with a hx of hypertension with demand ischemia during ER presentation with hypertensive urgency chest pain with normal coronary CTA and previous transient global amnesia last seen as an inpatient at [facility] April of this year. Compliance with diet, lifestyle and medications: Yes  Monitor was reported 05/17/2020 which showed rare ventricular and supraventricular ectopy no bradycardia and no episodes of atrial fibrillation or flutter. Cardiac CTA reported 05/05/2020 shows a calcium score of 0 normal coronary arteries.  Over read by radiology showed aortic atherosclerosis  She felt terribly when she took a beta-blocker with weakness and decision was made to restart a thiazide diuretic and her blood pressures are consistently in the range of 120-130/70 at home. No recurrent chest pain or neurologic symptoms. I given her prescription for an ARB to take for systolics greater than 371 at the house. Past Medical History:  Diagnosis Date  . Arthritis   . Asthma    as child  . Goiter, non-toxic   . Hypertension   . PONV (postoperative nausea and vomiting)   . Seasonal allergies     Past Surgical History:  Procedure Laterality Date  . arthroscopic knee  Left   . BREAST EXCISIONAL BIOPSY Left 2002  . BREAST SURGERY     lumpectomy  . CERVICAL FUSION    . CHOLECYSTECTOMY    . EYE SURGERY Bilateral  cataracts  . KNEE ARTHROSCOPY Right 10/21/2014   Procedure: ARTHROSCOPY KNEE;  Surgeon: Vickey Huger, MD;  Location: [place];  Service: Orthopedics;  Laterality: Right;    Current Medications: Current Meds  Medication Sig  . albuterol (PROVENTIL HFA;VENTOLIN HFA) 108 (90 Base) MCG/ACT inhaler Inhale 2 puffs into the lungs every 4 (four) hours as needed for wheezing or shortness of breath.  . Ascorbic Acid (VITAMIN C) 1000 MG tablet Take  1,000 mg by mouth daily.  . ASPIRIN LOW DOSE 81 MG EC tablet Take 81 mg by mouth daily.  . cetirizine (ZYRTEC) 10 MG chewable tablet Chew 10 mg by mouth daily.  . Cholecalciferol (VITAMIN D3) 3000 UNITS TABS Take 1 capsule by mouth daily.  Marland Kitchen EPINEPHrine 0.3 mg/0.3 mL IJ SOAJ injection Inject 0.3 mg into the skin as needed for allergies.  . famotidine (PEPCID) 10 MG tablet Take 10 mg by mouth daily.  . fluticasone (FLONASE) 50 MCG/ACT nasal spray Place 1 spray into both nostrils daily.  . hydrochlorothiazide (MICROZIDE) 12.5 MG capsule Take 12.5 mg by mouth daily.  Marland Kitchen ibuprofen (ADVIL,MOTRIN) 600 MG tablet Take 1 tablet (600 mg total) by mouth 3 (three) times daily. (Patient taking differently: Take 600 mg by mouth 3 (three) times daily as needed for moderate pain.)  . LORazepam (ATIVAN) 0.5 MG tablet Take 0.5 mg by mouth at bedtime.  . Melatonin 3 MG TABS Take 1 tablet by mouth at bedtime.  . pravastatin (PRAVACHOL) 20 MG tablet Take 20 mg by mouth 2 (two) times a week.     Allergies:   Doxycycline, Penicillins, Phenobarbital, Shellfish allergy, Statins, Levaquin [levofloxacin in d5w], and Sertraline   Social History   Socioeconomic History  . Marital status: Married    Spouse name: Not on file  . Number of children: Not on file  . Years of education: Not on file  . Highest education level: Not on file  Occupational History  . Not on file  Tobacco Use  . Smoking status: Former Smoker    Packs/day: 1.00    Years: 8.00    Pack years: 8.00    Types: Cigarettes    Quit date: 01/11/1978    Years since quitting: 42.4  . Smokeless tobacco: Never Used  Vaping Use  . Vaping Use: Never used  Substance and Sexual Activity  . Alcohol use: Yes    Comment: socially  . Drug use: No  . Sexual activity: Not on file  Other Topics Concern  . Not on file  Social History Narrative  . Not on file   Social Determinants of Health   Financial Resource Strain: Not on file  Food Insecurity: Not  on file  Transportation Needs: Not on file  Physical Activity: Not on file  Stress: Not on file  Social Connections: Not on file     Family History: The patient's family history includes Breast cancer in her cousin, cousin, cousin, and maternal aunt; COPD in her mother; Heart attack in her maternal grandfather; High blood pressure in her father and mother; Melanoma in her maternal grandmother. ROS:   Please see the history of present illness.    All other systems reviewed and are negative.  EKGs/Labs/Other Studies Reviewed:    The following studies were reviewed today:  EKG done today shows sinus rhythm and is normal Physical Exam:    VS:  BP (!) 160/82 (BP Location: Right Arm, Patient Position: Sitting, Cuff Size: Normal)   Pulse 65   Ht 5' 5.75" (  1.67 m)   Wt 159 lb (72.1 kg)   SpO2 96%   BMI 25.86 kg/m     Wt Readings from Last 3 Encounters:  05/28/20 159 lb (72.1 kg)  01/06/17 164 lb 6.4 oz (74.6 kg)  10/21/14 172 lb (78 kg)     GEN:  Well nourished, well developed in no acute distress HEENT: Normal NECK: No JVD; No carotid bruits LYMPHATICS: No lymphadenopathy CARDIAC: RRR, no murmurs, rubs, gallops RESPIRATORY:  Clear to auscultation without rales, wheezing or rhonchi  ABDOMEN: Soft, non-tender, non-distended MUSCULOSKELETAL:  No edema; No deformity  SKIN: Warm and dry NEUROLOGIC:  Alert and oriented x 3 PSYCHIATRIC:  Normal affect    Signed, Shirlee More, MD  05/28/2020 2:14 PM    [facility]

## 2020-05-28 NOTE — Patient Instructions (Signed)
Medication Instructions:  Your physician has recommended you make the following change in your medication:  START: Losartan 50 mg take one tablet by mouth daily as needed. Only take one tablet daily if your blood pressure is greater than 165 on top. *If you need a refill on your cardiac medications before your next appointment, please call your pharmacy*   Lab Work: None If you have labs (blood work) drawn today and your tests are completely normal, you will receive your results only by: Marland Kitchen MyChart Message (if you have MyChart) OR . A paper copy in the mail If you have any lab test that is abnormal or we need to change your treatment, we will call you to review the results.   Testing/Procedures: None   Follow-Up: At [facility], you and your health needs are our priority.  As part of our continuing mission to provide you with exceptional heart care, we have created designated Provider Care Teams.  These Care Teams include your primary Cardiologist (physician) and Advanced Practice Providers (APPs -  Physician Assistants and Nurse Practitioners) who all work together to provide you with the care you need, when you need it.  We recommend signing up for the patient portal called "MyChart".  Sign up information is provided on this After Visit Summary.  MyChart is used to connect with patients for Virtual Visits (Telemedicine).  Patients are able to view lab/test results, encounter notes, upcoming appointments, etc.  Non-urgent messages can be sent to your provider as well.   To learn more about what you can do with MyChart, go to [email or web address].    Your next appointment:   As needed  The format for your next appointment:   In Person  Provider:   Shirlee More, MD   Other Instructions

## 2020-05-29 DIAGNOSIS — Z20828 Contact with and (suspected) exposure to other viral communicable diseases: Secondary | ICD-10-CM | POA: Diagnosis not present

## 2020-06-16 ENCOUNTER — Other Ambulatory Visit: Payer: Self-pay

## 2020-06-16 ENCOUNTER — Ambulatory Visit: Payer: PPO | Admitting: Allergy and Immunology

## 2020-06-16 ENCOUNTER — Encounter: Payer: Self-pay | Admitting: Allergy and Immunology

## 2020-06-16 VITALS — BP 136/82 | HR 69 | Resp 16 | Ht 65.75 in | Wt 159.0 lb

## 2020-06-16 DIAGNOSIS — T7840XD Allergy, unspecified, subsequent encounter: Secondary | ICD-10-CM

## 2020-06-16 DIAGNOSIS — T781XXD Other adverse food reactions, not elsewhere classified, subsequent encounter: Secondary | ICD-10-CM | POA: Diagnosis not present

## 2020-06-16 DIAGNOSIS — J4520 Mild intermittent asthma, uncomplicated: Secondary | ICD-10-CM

## 2020-06-16 DIAGNOSIS — J301 Allergic rhinitis due to pollen: Secondary | ICD-10-CM | POA: Diagnosis not present

## 2020-06-16 DIAGNOSIS — J3089 Other allergic rhinitis: Secondary | ICD-10-CM | POA: Diagnosis not present

## 2020-06-16 NOTE — Progress Notes (Signed)
[facility] [place] [facility] [place] - [place]   Dear Dr. Bea Graff,  Thank you for referring [name] to the [facility] on 06/16/2020.   Below is a summation of this patient's evaluation and recommendations.  Thank you for your referral. I will keep you informed about this patient's response to treatment.   If you have any questions please do not hesitate to contact me.   Sincerely,  Jiles Prows, MD Allergy / Immunology [facility]   ______________________________________________________________________    NEW PATIENT NOTE  Referring Provider: Raina Mina., MD Primary Provider: Mayer Camel, NP Date of office visit: 06/16/2020    Subjective:   Chief Complaint:  Stacy Beasley (DOB: [birth year]) is a 70 y.o. female who presents to the clinic on 06/16/2020 with a chief complaint of Allergic Reaction (shrimp) and Allergic Rhinitis  .     HPI: Jackelyn Poling presents to this clinic in evaluation of possible allergic reactions.  In March 2022, after eating breaded shrimp, she immediately developed lower lip swelling and a "tingling tongue".  She took an extra Zyrtec and everything resolved within several hours.  She never had any associated systemic or constitutional symptoms.  Then, she has had 2 other "mild reactions" which was basically feeling as though her lower lip was swelling but no visual evidence of swelling after eating a jar of spaghetti meat sauce on 1 occasion and a combination of meats including beef and pork and chicken with the second occasion.  In addition, she does have a history of atopic disease manifested as sneezing and nasal congestion that occurs on a perennial basis and flares during the spring and summer especially during outdoor exposure.  She takes Flonase and Zyrtec which she thinks works pretty well.  And, she has a history of childhood  asthma and occasionally gets some chest tightness for which she may use a short acting bronchodilator about 1 time per week which works very well in resolving these issues.  She has been stuck in this pattern for decades and she has not required a systemic steroid to treat an exacerbation of asthma in many decades.  There does appear to be a common exposure for precipitating both her upper airway and her lower airway issue and that is when she helps out in the family chicken house.  She attempts to stay away from that exposure as much as possible.  In the distant past she also had an issue with "rashes" but that issue has completely resolved over the course of the past several years.  Past Medical History:  Diagnosis Date  . Arthritis   . Asthma    as child  . Goiter, non-toxic   . Hypertension   . PONV (postoperative nausea and vomiting)   . Seasonal allergies     Past Surgical History:  Procedure Laterality Date  . arthroscopic knee  Left   . BREAST EXCISIONAL BIOPSY Left 2002  . BREAST SURGERY     lumpectomy  . CERVICAL FUSION    . CHOLECYSTECTOMY    . EYE SURGERY Bilateral    cataracts  . KNEE ARTHROSCOPY Right 10/21/2014   Procedure: ARTHROSCOPY KNEE;  Surgeon: Vickey Huger, MD;  Location: [facility];  Service: Orthopedics;  Laterality: Right;    Allergies as of 06/16/2020      Reactions   Doxycycline    Penicillins    Phenobarbital Other (  See Comments)   hyperactivity   Shellfish Allergy    Statins Other (See Comments), Nausea Only   Other reaction(s): Arthralgias (intolerance), Other (See Comments)   Levaquin [levofloxacin In D5w] Rash   Sertraline Anxiety   Other reaction(s): Hypertension (intolerance)      Medication List    albuterol 108 (90 Base) MCG/ACT inhaler Commonly known as: VENTOLIN HFA Inhale 2 puffs into the lungs every 4 (four) hours as needed for wheezing or shortness of breath.   Aspirin Low Dose 81 MG EC tablet Generic drug: aspirin Take 81 mg by mouth  daily.   cetirizine 10 MG chewable tablet Commonly known as: ZYRTEC Chew 10 mg by mouth daily.   EPINEPHrine 0.3 mg/0.3 mL Soaj injection Commonly known as: EPI-PEN Inject 0.3 mg into the skin as needed for allergies.   famotidine 10 MG tablet Commonly known as: PEPCID Take 10 mg by mouth daily.   fluticasone 50 MCG/ACT nasal spray Commonly known as: FLONASE Place 1 spray into both nostrils daily.   hydrochlorothiazide 12.5 MG capsule Commonly known as: MICROZIDE Take 12.5 mg by mouth daily.   ibuprofen 600 MG tablet Commonly known as: ADVIL Take 600 mg by mouth every 8 (eight) hours as needed for mild pain.   LORazepam 0.5 MG tablet Commonly known as: ATIVAN Take 0.5 mg by mouth at bedtime.   melatonin 3 MG Tabs tablet Take 1 tablet by mouth at bedtime.   pravastatin 20 MG tablet Commonly known as: PRAVACHOL Take 1 tablet (20 mg total) by mouth 2 (two) times a week.   vitamin C 1000 MG tablet Take 1,000 mg by mouth daily.   Vitamin D3 75 MCG (3000 UT) Tabs Take 1 capsule by mouth daily.       Review of systems negative except as noted in HPI / PMHx or noted below:  Review of Systems  Constitutional: Negative.   HENT: Negative.   Eyes: Negative.   Respiratory: Negative.   Cardiovascular: Negative.   Gastrointestinal: Negative.   Genitourinary: Negative.   Musculoskeletal: Negative.   Skin: Negative.   Neurological: Negative.   Endo/Heme/Allergies: Negative.   Psychiatric/Behavioral: Negative.     Family History  Problem Relation Age of Onset  . COPD Mother   . High blood pressure Mother   . High blood pressure Father   . Melanoma Maternal Grandmother   . Heart attack Maternal Grandfather   . Breast cancer Maternal Aunt   . Breast cancer Cousin   . Breast cancer Cousin   . Breast cancer Cousin     Social History   Socioeconomic History  . Marital status: Married    Spouse name: Not on file  . Number of children: Not on file  . Years of  education: Not on file  . Highest education level: Not on file  Occupational History  . Not on file  Tobacco Use  . Smoking status: Former Smoker    Packs/day: 1.00    Years: 8.00    Pack years: 8.00    Types: Cigarettes    Quit date: 01/11/1978    Years since quitting: 42.4  . Smokeless tobacco: Never Used  Vaping Use  . Vaping Use: Never used  Substance and Sexual Activity  . Alcohol use: Yes    Comment: socially  . Drug use: No  . Sexual activity: Not on file  Other Topics Concern  . Not on file  Social History Narrative  . Not on file    Environmental and  Social history  Lives in a house with a dry environment, cats located inside the household, carpet in the bedroom, no plastic on the bed, no plastic on the pillow, no smoking ongoing with inside the household.  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  Objective:   Vitals:   06/16/20 1409  BP: 136/82  Pulse: 69  Resp: 16  SpO2: 97%   Height: 5' 5.75" (167 cm) Weight: 159 lb (72.1 kg)  Physical Exam Constitutional:      Appearance: She is not diaphoretic.  HENT:     Head: Normocephalic.     Right Ear: Ear canal and external ear normal.     Left Ear: Ear canal and external ear normal.     Ears:     Comments: Bilateral hearing aids    Nose: Nose normal. No mucosal edema or rhinorrhea.     Mouth/Throat:     Pharynx: Uvula midline. No oropharyngeal exudate.  Eyes:     Conjunctiva/sclera: Conjunctivae normal.  Neck:     Thyroid: No thyromegaly.     Trachea: Trachea normal. No tracheal tenderness or tracheal deviation.  Cardiovascular:     Rate and Rhythm: Normal rate and regular rhythm.     Heart sounds: Normal heart sounds, S1 normal and S2 normal. No murmur heard.   Pulmonary:     Effort: No respiratory distress.     Breath sounds: Normal breath sounds. No stridor. No wheezing or rales.  Lymphadenopathy:     Head:     Right side of head: No tonsillar  adenopathy.     Left side of head: No tonsillar adenopathy.     Cervical: No cervical adenopathy.  Skin:    Findings: No erythema or rash.     Nails: There is no clubbing.  Neurological:     Mental Status: She is alert.     Diagnostics: Allergy skin tests were performed.  She did not demonstrate any hypersensitivity against a screening panel of aeroallergens or spices including cinnamon, nutmeg, ginger, garlic, pepper, mustard.  Spirometry was performed and demonstrated an FEV1 of 1.72 @ 72 % of predicted. FEV1/FVC = 0.75  Assessment and Plan:    1. Adverse food reaction, subsequent encounter   2. Allergic reaction, subsequent encounter   3. Perennial allergic rhinitis   4. Seasonal allergic rhinitis due to pollen   5. Asthma, mild intermittent, well-controlled      1.  Allergen avoidance measures?  2.  Continue Flonase and Zyrtec for upper airway issues.  3.  Continue albuterol HFA 2 inhalations every 4-6 hours if needed for lower airway issues.   4.  Blood -shellfish panel, spice panel, alpha gal panel  5.  Further evaluation??? Yes, if with recurrent reactions  It is not entirely clear why Debbie had a significant reaction in March 2022 followed by 2 milder reactions.  We are going to further explore this issue by checking the blood test noted above.  For her other atopic disease she can continue on Flonase and Zyrtec and albuterol as noted above.  I will contact her with the results of her blood test once they are available for review.  If she does not have any antibodies against shellfish members we will be challenging her in this clinic with shrimp.  Jiles Prows, MD Allergy / Immunology [facility]

## 2020-06-16 NOTE — Patient Instructions (Addendum)
  1.  Allergen avoidance measures?  2.  Continue Flonase and Zyrtec for upper airway issues.  3.  Continue albuterol HFA 2 inhalations every 4-6 hours if needed for lower airway issues.   4.  Blood -shellfish panel, spice panel, alpha gal panel  5. Further evaluation??? Yes, if with recurrent reactions

## 2020-06-17 ENCOUNTER — Encounter: Payer: Self-pay | Admitting: Allergy and Immunology

## 2020-06-19 DIAGNOSIS — E040 Nontoxic diffuse goiter: Secondary | ICD-10-CM | POA: Diagnosis not present

## 2020-06-22 LAB — ALLERGEN PROFILE, SHELLFISH
Clam IgE: <0.1 kU/L
F023-IgE Crab: <0.1 kU/L
F080-IgE Lobster: <0.1 kU/L
F290-IgE Oyster: <0.1 kU/L
Scallop IgE: <0.1 kU/L
Shrimp IgE: <0.1 kU/L

## 2020-06-22 LAB — ALPHA-GAL PANEL
Allergen Lamb IgE: 0.28 kU/L — AB
Beef IgE: 0.45 kU/L — AB
IgE (Immunoglobulin E), Serum: 26 IU/mL (ref 6–495)
O215-IgE Alpha-Gal: 2.22 kU/L — AB
Pork IgE: 0.4 kU/L — AB

## 2020-06-22 LAB — ALLERGEN, GARLIC, F47: Allergen Garlic IgE: <0.1 kU/L

## 2020-06-22 LAB — ALLERGEN, OREGANO, RF283: F283-IgE Oregano: <0.1 kU/L

## 2020-06-22 LAB — ALLERGEN,GRN PEPPER,PAPRIKA,F218: Paprika IgE: <0.1 kU/L

## 2020-06-22 LAB — ALLERGEN, BLACK PEPPER,F280: Allergen Black Pepper IgE: <0.1 kU/L

## 2020-07-22 DIAGNOSIS — D801 Nonfamilial hypogammaglobulinemia: Secondary | ICD-10-CM | POA: Diagnosis not present

## 2020-07-22 DIAGNOSIS — E040 Nontoxic diffuse goiter: Secondary | ICD-10-CM | POA: Diagnosis not present

## 2020-07-22 DIAGNOSIS — J3089 Other allergic rhinitis: Secondary | ICD-10-CM | POA: Diagnosis not present

## 2020-07-22 DIAGNOSIS — M159 Polyosteoarthritis, unspecified: Secondary | ICD-10-CM | POA: Diagnosis not present

## 2020-07-22 DIAGNOSIS — L508 Other urticaria: Secondary | ICD-10-CM | POA: Diagnosis not present

## 2020-07-22 DIAGNOSIS — F419 Anxiety disorder, unspecified: Secondary | ICD-10-CM | POA: Diagnosis not present

## 2020-07-22 DIAGNOSIS — E782 Mixed hyperlipidemia: Secondary | ICD-10-CM | POA: Diagnosis not present

## 2020-07-22 DIAGNOSIS — I7090 Unspecified atherosclerosis: Secondary | ICD-10-CM | POA: Diagnosis not present

## 2020-07-22 DIAGNOSIS — Z9889 Other specified postprocedural states: Secondary | ICD-10-CM | POA: Diagnosis not present

## 2020-07-22 DIAGNOSIS — K219 Gastro-esophageal reflux disease without esophagitis: Secondary | ICD-10-CM | POA: Diagnosis not present

## 2020-07-22 DIAGNOSIS — I10 Essential (primary) hypertension: Secondary | ICD-10-CM | POA: Diagnosis not present

## 2020-07-22 DIAGNOSIS — I248 Other forms of acute ischemic heart disease: Secondary | ICD-10-CM | POA: Diagnosis not present

## 2020-07-24 ENCOUNTER — Telehealth: Payer: Self-pay

## 2020-07-24 NOTE — Telephone Encounter (Signed)
   Name: Stacy Beasley  DOB: 14-May-1950  MRN: [id number]   Primary Cardiologist: Shirlee More, MD  Chart reviewed as part of pre-operative protocol coverage.   Left a voicemail for patient to call back for ongoing preop assessment.  Abigail Butts, PA-C 07/24/2020, 10:49 AM

## 2020-07-24 NOTE — Telephone Encounter (Signed)
   [facility] Pre-operative Risk Assessment    Patient Name: Stacy Beasley  DOB: 05/30/1950 MRN: [id number]  HEARTCARE STAFF:  - IMPORTANT!!!!!! Under Visit Info/Reason for Call, type in Other and utilize the format Clearance MM/DD/YY or Clearance TBD. Do not use dashes or single digits. - Please review there is not already an duplicate clearance open for this procedure. - If request is for dental extraction, please clarify the # of teeth to be extracted. - If the patient is currently at the dentist's office, call Pre-Op Callback Staff (MA/nurse) to input urgent request.  - If the patient is not currently in the dentist office, please route to the Pre-Op pool.  Request for surgical clearance:  What type of surgery is being performed? TKA  When is this surgery scheduled? TBD  What type of clearance is required (medical clearance vs. Pharmacy clearance to hold med vs. Both)? BOTH  Are there any medications that need to be held prior to surgery and how long?  None noted  Practice name and name of physician performing surgery? [facility]  /  Rudean Haskell, MD  What is the office phone number?  [phone]   7.   What is the office fax number?  [phone]  8.   Anesthesia type (None, local, MAC, general) ? NOT NOTED   Toni Arthurs 07/24/2020, 7:46 AM  _________________________________________________________________   (provider comments below) -

## 2020-07-24 NOTE — Telephone Encounter (Signed)
Patient returning call.

## 2020-07-24 NOTE — Telephone Encounter (Signed)
   Name: Stacy Beasley  DOB: 16-May-1950  MRN: [id number]   Primary Cardiologist: Shirlee More, MD  Chart reviewed as part of pre-operative protocol coverage. Patient was contacted 07/24/2020 in reference to pre-operative risk assessment for pending surgery as outlined below.  Stacy Beasley was last seen on 05/28/20 by Dr. Bettina Gavia, at which time she was cleared to proceed with TKA.  Since that day, Stacy Beasley has done well. Despite her knee pain she can easily complete 4 METs without anginal complaints. She had a coronary CTA earlier this year which showed no evidence of CAD.  Therefore, based on ACC/AHA guidelines, the patient would be at acceptable risk for the planned procedure without further cardiovascular testing.   The patient was advised that if she develops new symptoms prior to surgery to contact our office to arrange for a follow-up visit, and she verbalized understanding.  I will route this recommendation to the requesting party via Epic fax function and remove from pre-op pool. Please call with questions.  Abigail Butts, PA-C 07/24/2020, 11:44 AM

## 2020-07-28 ENCOUNTER — Encounter: Payer: Self-pay | Admitting: Allergy and Immunology

## 2020-07-28 ENCOUNTER — Ambulatory Visit (INDEPENDENT_AMBULATORY_CARE_PROVIDER_SITE_OTHER): Payer: PPO | Admitting: Allergy and Immunology

## 2020-07-28 ENCOUNTER — Other Ambulatory Visit: Payer: Self-pay

## 2020-07-28 VITALS — BP 146/84 | HR 60 | Resp 10

## 2020-07-28 DIAGNOSIS — T781XXD Other adverse food reactions, not elsewhere classified, subsequent encounter: Secondary | ICD-10-CM

## 2020-07-31 DIAGNOSIS — M1711 Unilateral primary osteoarthritis, right knee: Secondary | ICD-10-CM | POA: Diagnosis not present

## 2020-08-14 ENCOUNTER — Encounter: Payer: Self-pay | Admitting: Neurology

## 2020-08-14 ENCOUNTER — Ambulatory Visit: Payer: PPO | Admitting: Neurology

## 2020-08-14 ENCOUNTER — Other Ambulatory Visit: Payer: Self-pay

## 2020-08-14 VITALS — BP 133/82 | HR 66 | Ht 67.0 in | Wt 154.0 lb

## 2020-08-14 DIAGNOSIS — G454 Transient global amnesia: Secondary | ICD-10-CM | POA: Diagnosis not present

## 2020-08-14 NOTE — Progress Notes (Signed)
Provider:  Larey Seat, M D  Referring Provider: Bess Harvest* Primary Care Physician:  Mayer Camel, NP  Chief Complaint  Patient presents with   New Patient (Initial Visit)    Rm 10, alone. 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 pt reports doing well today. No concerns with memory.     HPI:  Stacy Beasley is a 70 y.o. female  Is seen here upon  a referral from [facility] for TGA- The morning of the event in question the patient had felt chest pain or tightness and pressure and she measured her blood pressure at home and to her surprise this was over 99991111 systolic and over [id number] and diastolic pressure. 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    A peak high blood pressure is the most common associated cause of transient global amnesia.  She had been brought to the local hospital [facility] on 4-6 2022 and was discharged 2 days later.  She was in the hospital for about 2 or 3 hours awaiting troponin levels when she started to repeatedly ask her husband the same question over and over and this was the key to her transient global amnesia diagnosis.   There were slight abnormalities on LFTs, her heart rate was controlled on metoprolol but on occasion had been dropping into the 40s significant bradycardia.  She was placed on an outpatient cardiac event monitor to ensure that she did not have any arrhythmia event explaining this amnestic component MRI and MRA of the brain did not reveal any stroke,.  There were no new medications tried or added.  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   Since that event she has not reported any ongoing memory deficits, she reports  no difficulties with importing and retaining new memory.  No word finding difficulties.  End of May of this year she contracted COVID and in relation to that had to postpone a scheduled knee replacement surgery. She was diagnosed with alpha-Gal, and eliminated meat and dairy.  No history of seizures- she was a smoker in college, for about 1 year. No history of CKD, renal vascular disease.   Review of Systems: Out of a complete 14 system review, the patient complains of only the following symptoms, and all other reviewed systems are negative.  Fine now.   Social History   Socioeconomic History   Marital status: Married    Spouse name: [name]   Number of children: 2   Years of education: Not on file   Highest education level: Associate degree: academic program  Occupational History   Not on file  Tobacco Use   Smoking status: Former    Packs/day: 1.00    Years: 8.00    Pack years: 8.00    Types: Cigarettes    Quit date: 01/11/1978    Years since quitting: 42.6   Smokeless tobacco: Never  Vaping Use   Vaping Use: Never used  Substance and Sexual Activity   Alcohol use: Yes    Comment: socially   Drug use: No   Sexual activity: Not on file  Other Topics Concern   Not on file  Social History Narrative  Lives with husband   Right handed   Caffeine: 2-3 cups of coffee a day   Social Determinants of Health   Financial Resource Strain: Not on file  Food Insecurity: Not on file  Transportation Needs: Not on file  Physical Activity: Not on file  Stress: Not on file  Social Connections: Not on file  Intimate Partner Violence: Not on file    Family History  Problem Relation Age of Onset   COPD Mother    High blood pressure Mother    High blood pressure Father    Melanoma Maternal Grandmother    Heart attack Maternal Grandfather    Breast cancer Maternal Aunt    Breast cancer Cousin    Breast cancer Cousin    Breast cancer Cousin     Past Medical History:  Diagnosis  Date   Allergy to alpha-gal    Arthritis    Asthma    as child   Goiter, non-toxic    Hypertension    PONV (postoperative nausea and vomiting)    Seasonal allergies     Past Surgical History:  Procedure Laterality Date   arthroscopic knee  Left    BREAST EXCISIONAL BIOPSY Left 2002   BREAST SURGERY     lumpectomy   CERVICAL FUSION     CHOLECYSTECTOMY     EYE SURGERY Bilateral    cataracts   KNEE ARTHROSCOPY Right 10/21/2014   Procedure: ARTHROSCOPY KNEE;  Surgeon: Vickey Huger, MD;  Location: [facility];  Service: Orthopedics;  Laterality: Right;    Current Outpatient Medications  Medication Sig Dispense Refill   albuterol (PROVENTIL HFA;VENTOLIN HFA) 108 (90 Base) MCG/ACT inhaler Inhale 2 puffs into the lungs every 4 (four) hours as needed for wheezing or shortness of breath.     Ascorbic Acid (VITAMIN C) 1000 MG tablet Take 1,000 mg by mouth daily.     ASPIRIN LOW DOSE 81 MG EC tablet Take 81 mg by mouth daily.     cetirizine (ZYRTEC) 10 MG chewable tablet Chew 10 mg by mouth daily.     EPINEPHrine 0.3 mg/0.3 mL IJ SOAJ injection Inject 0.3 mg into the skin as needed for allergies.     Ergocalciferol (VITAMIN D2 PO) Take 2,000 Units by mouth daily.     famotidine (PEPCID) 20 MG tablet Take 20 mg by mouth 2 (two) times daily.     fluticasone (FLONASE) 50 MCG/ACT nasal spray Place 1 spray into both nostrils daily.     hydrochlorothiazide (MICROZIDE) 12.5 MG capsule Take 12.5 mg by mouth daily.     ibuprofen (ADVIL) 600 MG tablet Take 600 mg by mouth every 8 (eight) hours as needed for mild pain.     LORazepam (ATIVAN) 1 MG tablet Take 0.5-1 mg by mouth every 8 (eight) hours as needed.     Melatonin 3 MG TABS Take 1 tablet by mouth at bedtime.     pravastatin (PRAVACHOL) 20 MG tablet Take 1 tablet (20 mg total) by mouth 2 (two) times a week. 30 tablet 3   No current facility-administered medications for this visit.    Allergies as of 08/14/2020 - Review Complete 08/14/2020   Allergen Reaction Noted   Alpha-gal Other (See Comments), Itching, and Shortness Of Breath 06/17/2020   Doxycycline  12/18/2012   Penicillins  12/18/2012   Phenobarbital Other (See Comments) 10/08/2014   Statins Other (See Comments) and Nausea Only 05/29/2019   Levaquin [levofloxacin in d5w] Rash 10/08/2014   Sertraline Anxiety 05/29/2019  Vitals: BP 133/82   Pulse 66   Ht '5\' 7"'$  (1.702 m)   Wt 154 lb (69.9 kg)   BMI 24.12 kg/m  Last Weight:  Wt Readings from Last 1 Encounters:  08/14/20 154 lb (69.9 kg)   Last Height:   Ht Readings from Last 1 Encounters:  08/14/20 '5\' 7"'$  (1.702 m)    Physical exam:  General: The patient is awake, alert and appears not in acute distress. The patient is well groomed. Head: Normocephalic, atraumatic. Neck is supple. Skin:  Without evidence of edema, or rash Trunk: BMI is 24 and patient  has normal posture.  Neurologic exam : The patient is awake and alert, oriented to place and time.   Memory subjective  described as intact. There is a normal attention span & concentration ability. Speech is fluent without  dysarthria, dysphonia or aphasia. Mood and affect are appropriate.  Cranial nerves: Pupils are equal and briskly reactive to light. Funduscopic exam without evidence of pallor or edema. Status post cataract surgery- Extraocular movements  in vertical and horizontal planes intact and without nystagmus.  Visual fields by finger perimetry are intact. Hearing to finger rub intact.  Facial sensation intact to fine touch. Facial motor strength is symmetric and tongue and uvula move midline. Tongue protrusion into either cheek is normal. Shoulder shrug is normal.   Motor exam:   Normal tone ,muscle bulk and symmetric  strength in all extremities.  Sensory:  vibration was normal.  Coordination: Rapid alternating movements in the fingers/hands were normal.  Finger-to-nose maneuver  normal without evidence of ataxia, dysmetria or  tremor.  Gait and station: Patient walks without assistive device and is able unassisted to climb up to the exam table. Strength within normal limits. Stance is stable and normal.Romberg testing is negative   Deep tendon reflexes: in the  upper and lower extremities are symmetric and intact.  Assessment:  After physical and neurologic examination, review of laboratory studies, imaging, neurophysiology testing and pre-existing records, assessment is that of :  TGA - single event.   Plan:  Treatment plan and additional workup :  No need to follow up.      Asencion Partridge Lizzett Nobile MD 08/14/2020

## 2020-08-14 NOTE — Patient Instructions (Signed)

## 2020-08-19 DIAGNOSIS — R5381 Other malaise: Secondary | ICD-10-CM | POA: Diagnosis not present

## 2020-08-19 DIAGNOSIS — E559 Vitamin D deficiency, unspecified: Secondary | ICD-10-CM | POA: Diagnosis not present

## 2020-08-19 DIAGNOSIS — E782 Mixed hyperlipidemia: Secondary | ICD-10-CM | POA: Diagnosis not present

## 2020-08-19 DIAGNOSIS — E538 Deficiency of other specified B group vitamins: Secondary | ICD-10-CM | POA: Diagnosis not present

## 2020-08-19 DIAGNOSIS — R5383 Other fatigue: Secondary | ICD-10-CM | POA: Diagnosis not present

## 2020-08-27 ENCOUNTER — Encounter: Payer: Self-pay | Admitting: Allergy and Immunology

## 2020-08-27 NOTE — Progress Notes (Signed)
Using an incremental oral food challenge protocol Stacy Beasley was fed approximately 3 shrimp over the course of 3 hours and did not demonstrate any hypersensitivity reaction with this exposure.

## 2020-09-29 DIAGNOSIS — F419 Anxiety disorder, unspecified: Secondary | ICD-10-CM | POA: Diagnosis not present

## 2020-09-29 DIAGNOSIS — L239 Allergic contact dermatitis, unspecified cause: Secondary | ICD-10-CM | POA: Diagnosis not present

## 2020-09-29 DIAGNOSIS — H1032 Unspecified acute conjunctivitis, left eye: Secondary | ICD-10-CM | POA: Diagnosis not present

## 2020-09-29 DIAGNOSIS — Z419 Encounter for procedure for purposes other than remedying health state, unspecified: Secondary | ICD-10-CM | POA: Diagnosis not present

## 2020-10-03 DIAGNOSIS — H5789 Other specified disorders of eye and adnexa: Secondary | ICD-10-CM | POA: Diagnosis not present

## 2020-10-27 DIAGNOSIS — G8918 Other acute postprocedural pain: Secondary | ICD-10-CM | POA: Diagnosis not present

## 2020-10-27 DIAGNOSIS — M1711 Unilateral primary osteoarthritis, right knee: Secondary | ICD-10-CM | POA: Diagnosis not present

## 2020-10-27 HISTORY — PX: REPLACEMENT TOTAL KNEE: SUR1224

## 2020-11-03 DIAGNOSIS — Z96651 Presence of right artificial knee joint: Secondary | ICD-10-CM | POA: Diagnosis not present

## 2020-11-03 DIAGNOSIS — M25661 Stiffness of right knee, not elsewhere classified: Secondary | ICD-10-CM | POA: Diagnosis not present

## 2020-11-03 DIAGNOSIS — R2689 Other abnormalities of gait and mobility: Secondary | ICD-10-CM | POA: Diagnosis not present

## 2020-11-03 DIAGNOSIS — M25561 Pain in right knee: Secondary | ICD-10-CM | POA: Diagnosis not present

## 2020-11-06 DIAGNOSIS — Z96651 Presence of right artificial knee joint: Secondary | ICD-10-CM | POA: Diagnosis not present

## 2020-11-07 DIAGNOSIS — Z96651 Presence of right artificial knee joint: Secondary | ICD-10-CM | POA: Diagnosis not present

## 2020-11-07 DIAGNOSIS — M25561 Pain in right knee: Secondary | ICD-10-CM | POA: Diagnosis not present

## 2020-11-07 DIAGNOSIS — M25661 Stiffness of right knee, not elsewhere classified: Secondary | ICD-10-CM | POA: Diagnosis not present

## 2020-11-07 DIAGNOSIS — R2689 Other abnormalities of gait and mobility: Secondary | ICD-10-CM | POA: Diagnosis not present

## 2020-11-10 DIAGNOSIS — R2689 Other abnormalities of gait and mobility: Secondary | ICD-10-CM | POA: Diagnosis not present

## 2020-11-10 DIAGNOSIS — Z96651 Presence of right artificial knee joint: Secondary | ICD-10-CM | POA: Diagnosis not present

## 2020-11-10 DIAGNOSIS — M25561 Pain in right knee: Secondary | ICD-10-CM | POA: Diagnosis not present

## 2020-11-10 DIAGNOSIS — M25661 Stiffness of right knee, not elsewhere classified: Secondary | ICD-10-CM | POA: Diagnosis not present

## 2020-11-14 DIAGNOSIS — R2689 Other abnormalities of gait and mobility: Secondary | ICD-10-CM | POA: Diagnosis not present

## 2020-11-14 DIAGNOSIS — Z96651 Presence of right artificial knee joint: Secondary | ICD-10-CM | POA: Diagnosis not present

## 2020-11-14 DIAGNOSIS — M25661 Stiffness of right knee, not elsewhere classified: Secondary | ICD-10-CM | POA: Diagnosis not present

## 2020-11-14 DIAGNOSIS — M25561 Pain in right knee: Secondary | ICD-10-CM | POA: Diagnosis not present

## 2020-11-17 DIAGNOSIS — M25661 Stiffness of right knee, not elsewhere classified: Secondary | ICD-10-CM | POA: Diagnosis not present

## 2020-11-17 DIAGNOSIS — R2689 Other abnormalities of gait and mobility: Secondary | ICD-10-CM | POA: Diagnosis not present

## 2020-11-17 DIAGNOSIS — M25561 Pain in right knee: Secondary | ICD-10-CM | POA: Diagnosis not present

## 2020-11-17 DIAGNOSIS — Z96651 Presence of right artificial knee joint: Secondary | ICD-10-CM | POA: Diagnosis not present

## 2020-11-20 DIAGNOSIS — R2689 Other abnormalities of gait and mobility: Secondary | ICD-10-CM | POA: Diagnosis not present

## 2020-11-20 DIAGNOSIS — Z96651 Presence of right artificial knee joint: Secondary | ICD-10-CM | POA: Diagnosis not present

## 2020-11-20 DIAGNOSIS — M25661 Stiffness of right knee, not elsewhere classified: Secondary | ICD-10-CM | POA: Diagnosis not present

## 2020-11-20 DIAGNOSIS — M25561 Pain in right knee: Secondary | ICD-10-CM | POA: Diagnosis not present

## 2020-11-24 DIAGNOSIS — M25561 Pain in right knee: Secondary | ICD-10-CM | POA: Diagnosis not present

## 2020-11-24 DIAGNOSIS — M25661 Stiffness of right knee, not elsewhere classified: Secondary | ICD-10-CM | POA: Diagnosis not present

## 2020-11-24 DIAGNOSIS — R2689 Other abnormalities of gait and mobility: Secondary | ICD-10-CM | POA: Diagnosis not present

## 2020-11-24 DIAGNOSIS — Z96651 Presence of right artificial knee joint: Secondary | ICD-10-CM | POA: Diagnosis not present

## 2020-11-25 DIAGNOSIS — K5792 Diverticulitis of intestine, part unspecified, without perforation or abscess without bleeding: Secondary | ICD-10-CM | POA: Diagnosis not present

## 2020-11-25 DIAGNOSIS — R1032 Left lower quadrant pain: Secondary | ICD-10-CM | POA: Diagnosis not present

## 2020-11-26 DIAGNOSIS — R935 Abnormal findings on diagnostic imaging of other abdominal regions, including retroperitoneum: Secondary | ICD-10-CM | POA: Diagnosis not present

## 2020-11-26 DIAGNOSIS — R1032 Left lower quadrant pain: Secondary | ICD-10-CM | POA: Diagnosis not present

## 2020-11-26 DIAGNOSIS — I700 Atherosclerosis of aorta: Secondary | ICD-10-CM | POA: Diagnosis not present

## 2020-11-26 DIAGNOSIS — R109 Unspecified abdominal pain: Secondary | ICD-10-CM | POA: Diagnosis not present

## 2020-12-02 DIAGNOSIS — Z96651 Presence of right artificial knee joint: Secondary | ICD-10-CM | POA: Diagnosis not present

## 2020-12-02 DIAGNOSIS — R2689 Other abnormalities of gait and mobility: Secondary | ICD-10-CM | POA: Diagnosis not present

## 2020-12-02 DIAGNOSIS — M25561 Pain in right knee: Secondary | ICD-10-CM | POA: Diagnosis not present

## 2020-12-02 DIAGNOSIS — M25661 Stiffness of right knee, not elsewhere classified: Secondary | ICD-10-CM | POA: Diagnosis not present

## 2020-12-05 DIAGNOSIS — M25561 Pain in right knee: Secondary | ICD-10-CM | POA: Diagnosis not present

## 2020-12-05 DIAGNOSIS — R2689 Other abnormalities of gait and mobility: Secondary | ICD-10-CM | POA: Diagnosis not present

## 2020-12-05 DIAGNOSIS — M25661 Stiffness of right knee, not elsewhere classified: Secondary | ICD-10-CM | POA: Diagnosis not present

## 2020-12-05 DIAGNOSIS — Z96651 Presence of right artificial knee joint: Secondary | ICD-10-CM | POA: Diagnosis not present

## 2020-12-08 DIAGNOSIS — R2689 Other abnormalities of gait and mobility: Secondary | ICD-10-CM | POA: Diagnosis not present

## 2020-12-08 DIAGNOSIS — M25561 Pain in right knee: Secondary | ICD-10-CM | POA: Diagnosis not present

## 2020-12-08 DIAGNOSIS — M25661 Stiffness of right knee, not elsewhere classified: Secondary | ICD-10-CM | POA: Diagnosis not present

## 2020-12-08 DIAGNOSIS — Z96651 Presence of right artificial knee joint: Secondary | ICD-10-CM | POA: Diagnosis not present

## 2020-12-11 DIAGNOSIS — Z96651 Presence of right artificial knee joint: Secondary | ICD-10-CM | POA: Diagnosis not present

## 2020-12-11 DIAGNOSIS — M25661 Stiffness of right knee, not elsewhere classified: Secondary | ICD-10-CM | POA: Diagnosis not present

## 2020-12-11 DIAGNOSIS — R2689 Other abnormalities of gait and mobility: Secondary | ICD-10-CM | POA: Diagnosis not present

## 2020-12-11 DIAGNOSIS — M25561 Pain in right knee: Secondary | ICD-10-CM | POA: Diagnosis not present

## 2020-12-29 DIAGNOSIS — Z01818 Encounter for other preprocedural examination: Secondary | ICD-10-CM | POA: Diagnosis not present

## 2020-12-29 DIAGNOSIS — M79661 Pain in right lower leg: Secondary | ICD-10-CM | POA: Diagnosis not present

## 2020-12-29 DIAGNOSIS — R600 Localized edema: Secondary | ICD-10-CM | POA: Diagnosis not present

## 2020-12-29 DIAGNOSIS — Z96651 Presence of right artificial knee joint: Secondary | ICD-10-CM | POA: Diagnosis not present

## 2020-12-29 DIAGNOSIS — M79604 Pain in right leg: Secondary | ICD-10-CM | POA: Diagnosis not present

## 2021-04-06 ENCOUNTER — Encounter: Payer: Self-pay | Admitting: Gastroenterology

## 2021-05-25 ENCOUNTER — Encounter: Payer: Self-pay | Admitting: Gastroenterology

## 2021-05-25 ENCOUNTER — Ambulatory Visit: Payer: PPO | Admitting: Gastroenterology

## 2021-05-25 VITALS — BP 130/82 | HR 65 | Ht 67.0 in | Wt 144.0 lb

## 2021-05-25 DIAGNOSIS — Z8719 Personal history of other diseases of the digestive system: Secondary | ICD-10-CM

## 2021-05-25 DIAGNOSIS — K5909 Other constipation: Secondary | ICD-10-CM

## 2021-05-25 MED ORDER — CLENPIQ 10-3.5-12 MG-GM -GM/160ML PO SOLN: 1.0000 | Freq: Once | ORAL | 0 refills | Status: AC

## 2021-05-25 NOTE — Progress Notes (Signed)
? ? ?Chief Complaint:  ? ?Referring Provider:  Bess Harvest*    ? ? ?ASSESSMENT AND PLAN;  ? ?#1. H/O recurrent sig  diverticulitis ? ?#2. Chronic constipation. ? ?Plan: ?-Colon  ?-Continue benefiber 1tbs po QD with 8 ounces of water ?-Start walking ?-Minimize NSAIDs. ?-CT from [facility] reviewed with pt. ? ? ?Discussed risks & benefits of colonoscopy. Risks including rare perforation req laparotomy, bleeding after bx/polypectomy req blood transfusion, rarely missing neoplasms, risks of anesthesia/sedation, rare risk of damage to internal organs. Benefits outweigh the risks. Patient agrees to proceed. All the questions were answered. Pt consents to proceed. ?HPI:   ? ?Stacy Beasley is a 71 y.o. female  ?With HTN, asthma, goiter, osteoarthritis, scoliosis, recently diagnosed with Alfagal def by Dr. Neldon Mc ? ?FU 2 episodes of diverticulitis 03/2020, Nov 2022 (after knee replacement) ?CT AP x 2 showed mild sigmoid diverticulitis without abscess.  Treated with Cipro/Flagyl with good results. ? ?She was continued on Benefiber.  Has done well since.  Here for colonoscopy.  Never had colonoscopy previously. ? ?Has history of chronic constipation, which got worse after knee replacement.  She has been drinking plenty of water.  Benefiber has helped.  Currently having 1 BM per day. ? ?No nausea, vomiting, heartburn, regurgitation, odynophagia or dysphagia.  No significant diarrhea.  No melena or hematochezia. No unintentional weight loss. No abdominal pain. ? ?SH: RH ? ?Past GI work-up: ? ?CT AP at [facility] 11/2020 ?1. Findings are consistent with recurrent uncomplicated proximal ?sigmoid colon diverticulitis. ?2. No evidence of bowel perforation, obstruction or abscess. ?Prominent stool throughout the colon. ?3. Aortic Atherosclerosis (ICD10-I70.0).  ? ?Past Medical History:  ?Diagnosis Date  ? Allergy to alpha-gal   ? Arthritis   ? Asthma   ? as child  ? Goiter, non-toxic   ? Hypertension   ? PONV (postoperative nausea and  vomiting)   ? Seasonal allergies   ? ? ?Past Surgical History:  ?Procedure Laterality Date  ? arthroscopic knee  Left   ? BREAST EXCISIONAL BIOPSY Left 2002  ? BREAST SURGERY    ? lumpectomy  ? CERVICAL FUSION    ? CHOLECYSTECTOMY    ? EYE SURGERY Bilateral   ? cataracts  ? KNEE ARTHROSCOPY Right 10/21/2014  ? Procedure: ARTHROSCOPY KNEE;  Surgeon: Vickey Huger, MD;  Location: [facility];  Service: Orthopedics;  Laterality: Right;  ? 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 ? ?Family History  ?Problem Relation Age of Onset  ? COPD Mother   ? High blood pressure Mother   ? High blood pressure Father   ? Melanoma Maternal Grandmother   ? Heart attack Maternal Grandfather   ? Breast cancer Maternal Aunt   ? Breast cancer Cousin   ? Breast cancer Cousin   ? Breast cancer Cousin   ? Colon cancer Neg Hx   ? Stomach cancer Neg Hx   ? Rectal cancer Neg Hx   ? ? ?Social History  ? ?Tobacco Use  ? Smoking status: Former  ?  Packs/day: 1.00  ?  Years: 8.00  ?  Pack years: 8.00  ?  Types: Cigarettes  ?  Quit date: 01/11/1978  ?  Years since quitting: 43.3  ? Smokeless tobacco: Never  ?Vaping Use  ? Vaping Use: Never used  ?Substance Use Topics  ? Alcohol use: Yes  ?  Comment: rare  ? Drug use: No  ? ? ?Current Outpatient Medications  ?Medication Sig Dispense Refill  ? albuterol (PROVENTIL  HFA;VENTOLIN HFA) 108 (90 Base) MCG/ACT inhaler Inhale 2 puffs into the lungs every 4 (four) hours as needed for wheezing or shortness of breath.    ? ASPIRIN LOW DOSE 81 MG EC tablet Take 81 mg by mouth daily. Three times a week    ? cetirizine (ZYRTEC) 10 MG chewable tablet Chew 10 mg by mouth 2 (two) times daily.    ? EPINEPHrine 0.3 mg/0.3 mL IJ SOAJ injection Inject 0.3 mg into the skin as needed for allergies.    ? famotidine (PEPCID) 20 MG tablet Take 20 mg by mouth 2 (two) times daily.    ? fluticasone (FLONASE) 50 MCG/ACT nasal spray Place 1 spray into both nostrils daily.    ? ibuprofen (ADVIL) 600 MG tablet Take 600 mg by mouth every 8  (eight) hours as needed for mild pain.    ? LORazepam (ATIVAN) 1 MG tablet Take 0.5-1 mg by mouth every 8 (eight) hours as needed.    ? Melatonin 3 MG TABS Take 1 tablet by mouth at bedtime.    ? pravastatin (PRAVACHOL) 20 MG tablet Take 1 tablet (20 mg total) by mouth 2 (two) times a week. 30 tablet 3  ? celecoxib (CELEBREX) 200 MG capsule Take 200 mg by mouth daily as needed.    ? furosemide (LASIX) 20 MG tablet Take 20 mg by mouth daily as needed.    ? Vitamin D, Ergocalciferol, (DRISDOL) 1.25 MG (50000 UNIT) CAPS capsule Take 50,000 Units by mouth once a week.    ? ?No current facility-administered medications for this visit.  ? ? ?Allergies  ?Allergen Reactions  ? Alpha-Gal Other (See Comments), Itching and Shortness Of Breath  ? Doxycycline   ? Penicillins   ? Phenobarbital Other (See Comments)  ?  hyperactivity  ? Statins Other (See Comments) and Nausea Only  ?  Other reaction(s): Arthralgias (intolerance), Other (See Comments)  ? Levaquin [Levofloxacin In D5w] Rash  ? Sertraline Anxiety  ?  Other reaction(s): Hypertension (intolerance)  ? ? ?Review of Systems:  ?Constitutional: Denies fever, chills, diaphoresis, appetite change and fatigue.  ?HEENT: Denies photophobia, eye pain, redness, hearing loss, ear pain, congestion, sore throat, rhinorrhea, sneezing, mouth sores, neck pain, neck stiffness and tinnitus.   ?Respiratory: Denies SOB, DOE, cough, chest tightness,  and wheezing.   ?Cardiovascular: Denies chest pain, palpitations and leg swelling.  ?Genitourinary: Denies dysuria, urgency, frequency, hematuria, flank pain and difficulty urinating.  ?Musculoskeletal: has myalgias, back pain, joint swelling, arthralgias and gait problem.  ?Skin: No rash.  ?Neurological: Denies dizziness, seizures, syncope, weakness, light-headedness, numbness and headaches.  ?Hematological: Denies adenopathy. Easy bruising, personal or family bleeding history  ?Psychiatric/Behavioral: Has anxiety or depression ? ?  ? ?Physical  Exam:   ? ?BP 130/82   Pulse 65   Ht '5\' 7"'$  (1.702 m)   Wt 144 lb (65.3 kg)   SpO2 99%   BMI 22.55 kg/m?  ?Wt Readings from Last 3 Encounters:  ?05/25/21 144 lb (65.3 kg)  ?08/14/20 154 lb (69.9 kg)  ?06/16/20 159 lb (72.1 kg)  ? ?Constitutional:  Well-developed, in no acute distress. ?Psychiatric: Normal mood and affect. Behavior is normal. ?HEENT: Pupils normal.  Conjunctivae are normal. No scleral icterus. ?Cardiovascular: Normal rate, regular rhythm. No edema ?Pulmonary/chest: Effort normal and breath sounds normal. No wheezing, rales or rhonchi. ?Abdominal: Soft, nondistended. Nontender. Bowel sounds active throughout. There are no masses palpable. No hepatomegaly. ?Rectal: Deferred ?Neurological: Alert and oriented to person place and time. ?Skin: Skin is  warm and dry. No rashes noted. ? ? ? ?Carmell Austria, MD 05/25/2021, 10:16 AM ? ?Cc: Brown-Patram, Melissa J* ? ? ?

## 2021-05-25 NOTE — Patient Instructions (Addendum)
If you are age 70 or older, your body mass index should be between 23-30. Your Body mass index is 22.55 kg/m?Marland Kitchen If this is out of the aforementioned range listed, please consider follow up with your Primary Care Provider. ? ?If you are age 72 or younger, your body mass index should be between 19-25. Your Body mass index is 22.55 kg/m?Marland Kitchen If this is out of the aformentioned range listed, please consider follow up with your Primary Care Provider.  ? ?________________________________________________________ ? ?The [place] [facility] providers would like to encourage you to use [facility] to communicate with providers for non-urgent requests or questions.  Due to long hold times on the telephone, sending your provider a message by [facility] may be a faster and more efficient way to get a response.  Please allow 48 business hours for a response.  Please remember that this is for non-urgent requests.  ?_______________________________________________________ ? ?You have been scheduled for a colonoscopy. Please follow written instructions given to you at your visit today.  ?Please pick up your prep supplies at the pharmacy within the next 1-3 days. ?If you use inhalers (even only as needed), please bring them with you on the day of your procedure. ? ?We have sent the following medications to your pharmacy for you to pick up at your convenience: ?Clenpiq ? ?Continue Benefiber 1 tablespoon daily ? ?Try to start walking daily. ? ?Minimize NSAID's ? ?Thank you, ? ?Dr. Jackquline Denmark ? ?

## 2021-06-01 ENCOUNTER — Encounter: Payer: Self-pay | Admitting: Gastroenterology

## 2021-06-01 DIAGNOSIS — Z8719 Personal history of other diseases of the digestive system: Secondary | ICD-10-CM

## 2021-06-01 DIAGNOSIS — K5909 Other constipation: Secondary | ICD-10-CM

## 2021-06-02 MED ORDER — NA SULFATE-K SULFATE-MG SULF 17.5-3.13-1.6 GM/177ML PO SOLN: 1.0000 | Freq: Once | ORAL | 0 refills | Status: AC

## 2021-07-01 ENCOUNTER — Ambulatory Visit (INDEPENDENT_AMBULATORY_CARE_PROVIDER_SITE_OTHER): Payer: PPO | Admitting: Allergy and Immunology

## 2021-07-01 ENCOUNTER — Encounter: Payer: Self-pay | Admitting: Allergy and Immunology

## 2021-07-01 VITALS — BP 132/82 | HR 63 | Resp 16 | Ht 65.75 in | Wt 144.0 lb

## 2021-07-01 DIAGNOSIS — J3089 Other allergic rhinitis: Secondary | ICD-10-CM

## 2021-07-01 DIAGNOSIS — J301 Allergic rhinitis due to pollen: Secondary | ICD-10-CM

## 2021-07-01 DIAGNOSIS — T781XXD Other adverse food reactions, not elsewhere classified, subsequent encounter: Secondary | ICD-10-CM | POA: Diagnosis not present

## 2021-07-01 DIAGNOSIS — J4520 Mild intermittent asthma, uncomplicated: Secondary | ICD-10-CM

## 2021-07-01 DIAGNOSIS — K219 Gastro-esophageal reflux disease without esophagitis: Secondary | ICD-10-CM | POA: Diagnosis not present

## 2021-07-01 NOTE — Patient Instructions (Signed)
  1.  Allergen avoidance measures -mammal consumption  2.  Continue Flonase and Zyrtec and Pepcid  3.  Continue albuterol HFA 2 inhalations every 4-6 hours if needed for lower airway issues.   4.  Blood -alpha gal panel  5.  Return to clinic in 1 year or earlier if problem

## 2021-07-01 NOTE — Progress Notes (Signed)
[place]   Follow-up Note  Referring Provider: Bess Harvest* Primary Provider: Mayer Camel, NP Date of Office Visit: 07/01/2021  Subjective:   Stacy Beasley (DOB: Dec 23, 1950) is a 71 y.o. female who returns to the [facility] on 07/01/2021 in re-evaluation of the following:  HPI: Stacy Beasley returns to this clinic in evaluation of alpha gal syndrome and allergic rhinitis.  Her last visit to this clinic was 28 June 2020.  When I last saw her in this clinic on 28 July 2020 we had her undergo a shrimp oral challenge and she did well with that challenge and now she is eating shellfish with no problem.  She remains away from consumption of all mammal.  She is doing quite well regarding her upper airway issue while using a combination of Flonase and Zyrtec.  She also takes Pepcid as she does have some reflux issues.  Her asthma has been relatively nonexistent and she has only used her short acting bronchodilator twice in the past year.  Allergies as of 07/01/2021       Reactions   Alpha-gal Other (See Comments), Itching, Shortness Of Breath   Doxycycline    Penicillins    Phenobarbital Other (See Comments)   hyperactivity   Statins Other (See Comments), Nausea Only   Other reaction(s): Arthralgias (intolerance), Other (See Comments)   Levaquin [levofloxacin In D5w] Rash   Sertraline Anxiety   Other reaction(s): Hypertension (intolerance)        Medication List    albuterol 108 (90 Base) MCG/ACT inhaler Commonly known as: VENTOLIN HFA Inhale 2 puffs into the lungs every 4 (four) hours as needed for wheezing or shortness of breath.   Aspirin Low Dose 81 MG tablet Generic drug: aspirin EC Take 81 mg by mouth daily. Three times a week   celecoxib 200 MG capsule Commonly known as: CELEBREX Take 200 mg by mouth daily as needed.   cetirizine 10 MG chewable tablet Commonly known as:  ZYRTEC Chew 10 mg by mouth 2 (two) times daily.   EPINEPHrine 0.3 mg/0.3 mL Soaj injection Commonly known as: EPI-PEN Inject 0.3 mg into the skin as needed for allergies.   famotidine 20 MG tablet Commonly known as: PEPCID Take 20 mg by mouth 2 (two) times daily.   fluticasone 50 MCG/ACT nasal spray Commonly known as: FLONASE Place 1 spray into both nostrils daily.   furosemide 20 MG tablet Commonly known as: LASIX Take 20 mg by mouth daily as needed.   ibuprofen 600 MG tablet Commonly known as: ADVIL Take 600 mg by mouth every 8 (eight) hours as needed for mild pain.   LORazepam 1 MG tablet Commonly known as: ATIVAN Take 0.5-1 mg by mouth every 8 (eight) hours as needed.   melatonin 3 MG Tabs tablet Take 1 tablet by mouth at bedtime.   pravastatin 20 MG tablet Commonly known as: PRAVACHOL Take 1 tablet (20 mg total) by mouth 2 (two) times a week.   Vitamin D (Ergocalciferol) 1.25 MG (50000 UNIT) Caps capsule Commonly known as: DRISDOL Take 50,000 Units by mouth once a week.    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   HENT: Negative.    Eyes: Negative.   Respiratory: Negative.    Cardiovascular: Negative.   Gastrointestinal: Negative.   Genitourinary:  Negative.   Musculoskeletal: Negative.   Skin: Negative.   Neurological: Negative.   Endo/Heme/Allergies: Negative.   Psychiatric/Behavioral: Negative.       Objective:   Vitals:   07/01/21 1351  BP: 132/82  Pulse: 63  Resp: 16  SpO2: 97%   Height: 5' 5.75" (167 cm)  Weight: 144 lb (65.3 kg)   Physical Exam Constitutional:      Appearance: She is not diaphoretic.  HENT:     Head: Normocephalic.     Right Ear: Tympanic membrane, ear canal and external ear normal.     Left Ear: Tympanic membrane, ear canal and external ear normal.     Nose: Nose normal. No mucosal edema or rhinorrhea.     Mouth/Throat:     Pharynx: Uvula midline. No oropharyngeal exudate.  Eyes:     Conjunctiva/sclera: Conjunctivae normal.  Neck:     Thyroid: No thyromegaly.     Trachea: Trachea normal. No tracheal tenderness or tracheal deviation.  Cardiovascular:     Rate and Rhythm: Normal rate and regular rhythm.     Heart sounds: Normal heart sounds, S1 normal and S2 normal. No murmur heard. Pulmonary:     Effort: No respiratory distress.     Breath sounds: Normal breath sounds. No stridor. No wheezing or rales.  Lymphadenopathy:     Head:     Right side of head: No tonsillar adenopathy.     Left side of head: No tonsillar adenopathy.     Cervical: No cervical adenopathy.  Skin:    Findings: No erythema or rash.     Nails: There is no clubbing.  Neurological:     Mental Status: She is alert.     Diagnostics:    Results of blood tests obtained 16 June 2020 identified IgE antibodies directed against pork at 0.40 KU/L, [place] 0.46 KU/L, lamb 0.28 KU/L, alpha-gal 2.22 KU/L.  Assessment and Plan:   1. Adverse food reaction, subsequent encounter   2. Perennial allergic rhinitis   3. Seasonal allergic rhinitis due to pollen   4. Asthma, mild intermittent, well-controlled   5. Gastroesophageal reflux disease, unspecified whether esophagitis present      1.  Allergen avoidance measures  -mammal consumption  2.  Continue Flonase and Zyrtec and Pepcid  3.  Continue albuterol HFA 2 inhalations every 4-6 hours if needed for lower airway issues.   4.  Blood -alpha gal panel  5.  Return to clinic in 1 year or earlier if problem  We will work through [place] hypersensitivity directed against mammal by rechecking her alpha gal titer to see if she would be a candidate for possible food challenge.  Her respiratory atopic disease is going well and she can continue on the therapy  noted above.  I will contact her with the results of her blood test once they are available for review.  Allena Katz, MD Allergy / Immunology [place]

## 2021-07-02 ENCOUNTER — Encounter: Payer: Self-pay | Admitting: Allergy and Immunology

## 2021-07-03 ENCOUNTER — Ambulatory Visit (AMBULATORY_SURGERY_CENTER): Payer: PPO | Admitting: Gastroenterology

## 2021-07-03 ENCOUNTER — Encounter: Payer: Self-pay | Admitting: Gastroenterology

## 2021-07-03 VITALS — BP 139/59 | HR 59 | Temp 97.3°F | Resp 13 | Ht 67.0 in | Wt 144.0 lb

## 2021-07-03 DIAGNOSIS — K640 First degree hemorrhoids: Secondary | ICD-10-CM | POA: Diagnosis not present

## 2021-07-03 DIAGNOSIS — D122 Benign neoplasm of ascending colon: Secondary | ICD-10-CM | POA: Diagnosis not present

## 2021-07-03 DIAGNOSIS — D123 Benign neoplasm of transverse colon: Secondary | ICD-10-CM

## 2021-07-03 DIAGNOSIS — K5730 Diverticulosis of large intestine without perforation or abscess without bleeding: Secondary | ICD-10-CM

## 2021-07-03 DIAGNOSIS — D127 Benign neoplasm of rectosigmoid junction: Secondary | ICD-10-CM

## 2021-07-03 DIAGNOSIS — Z8719 Personal history of other diseases of the digestive system: Secondary | ICD-10-CM

## 2021-07-03 MED ORDER — SODIUM CHLORIDE 0.9 % IV SOLN: 500.0000 mL | Freq: Once | INTRAVENOUS | Status: DC

## 2021-07-03 NOTE — Progress Notes (Signed)
Chief Complaint:   Referring Provider:  Rhea Bleacher*      ASSESSMENT AND PLAN;   #1. H/O recurrent sig  diverticulitis  #2. Chronic constipation.  Plan: -Colon  -Continue benefiber 1tbs po QD with 8 ounces of water -Start walking -Minimize NSAIDs. -CT from [facility] reviewed with pt.   Discussed risks & benefits of colonoscopy. Risks including rare perforation req laparotomy, bleeding after bx/polypectomy req blood transfusion, rarely missing neoplasms, risks of anesthesia/sedation, rare risk of damage to internal organs. Benefits outweigh the risks. Patient agrees to proceed. All the questions were answered. Pt consents to proceed. HPI:    AERILYNN GABB is a 71 y.o. female  With HTN, asthma, goiter, osteoarthritis, scoliosis, recently diagnosed with Alfagal def by Dr. Lucie Leather  FU 2 episodes of diverticulitis 03/2020, Nov 2022 (after knee replacement) CT AP x 2 showed mild sigmoid diverticulitis without abscess.  Treated with Cipro/Flagyl with good results.  She was continued on Benefiber.  Has done well since.  Here for colonoscopy.  Never had colonoscopy previously.  Has history of chronic constipation, which got worse after knee replacement.  She has been drinking plenty of water.  Benefiber has helped.  Currently having 1 BM per day.  No nausea, vomiting, heartburn, regurgitation, odynophagia or dysphagia.  No significant diarrhea.  No melena or hematochezia. No unintentional weight loss. No abdominal pain.  SH: RH  Past GI work-up:  CT AP at [facility] 11/2020 1. Findings are consistent with recurrent uncomplicated proximal sigmoid colon diverticulitis. 2. No evidence of bowel perforation, obstruction or abscess. Prominent stool throughout the colon. 3. Aortic Atherosclerosis (ICD10-I70.0).   Past Medical History:  Diagnosis Date   Allergy to alpha-gal    Arthritis    Asthma    as child   Goiter, non-toxic    Hypertension    PONV (postoperative nausea and  vomiting)    Seasonal allergies     Past Surgical History:  Procedure Laterality Date   arthroscopic knee  Left    BREAST EXCISIONAL BIOPSY Left 2002   BREAST SURGERY     lumpectomy   CERVICAL FUSION     CHOLECYSTECTOMY     EYE SURGERY Bilateral    cataracts   KNEE ARTHROSCOPY Right 10/21/2014   Procedure: ARTHROSCOPY KNEE;  Surgeon: Dannielle Huh, MD;  Location: [facility];  Service: Orthopedics;  Laterality: Right;   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    Family History  Problem Relation Age of Onset   COPD Mother    High blood pressure Mother    High blood pressure Father    Melanoma Maternal Grandmother    Heart attack Maternal Grandfather    Breast cancer Maternal Aunt    Breast cancer Cousin    Breast cancer Cousin    Breast cancer Cousin    Colon cancer Neg Hx    Stomach cancer Neg Hx    Rectal cancer Neg Hx     Social History   Tobacco Use   Smoking status: Former    Packs/day: 1.00    Years: 8.00    Total pack years: 8.00    Types: Cigarettes    Quit date: 01/11/1978    Years since quitting: 43.5   Smokeless tobacco: Never  Vaping Use   Vaping Use: Never used  Substance Use Topics   Alcohol use: Yes    Comment: rare   Drug use: No    Current Outpatient Medications  Medication Sig Dispense Refill   ASPIRIN  LOW DOSE 81 MG EC tablet Take 81 mg by mouth daily. Three times a week     cetirizine (ZYRTEC) 10 MG chewable tablet Chew 10 mg by mouth 2 (two) times daily.     famotidine (PEPCID) 20 MG tablet Take 20 mg by mouth 2 (two) times daily.     fluticasone (FLONASE) 50 MCG/ACT nasal spray Place 1 spray into both nostrils daily.     furosemide (LASIX) 20 MG tablet Take 20 mg by mouth daily as needed.     LORazepam (ATIVAN) 1 MG tablet Take 0.5-1 mg by mouth every 8 (eight) hours as needed.     Melatonin 3 MG TABS Take 1 tablet by mouth at bedtime.     pravastatin (PRAVACHOL) 20 MG tablet Take 1 tablet (20 mg total) by mouth 2 (two) times a week. 30 tablet  3   Vitamin D, Ergocalciferol, (DRISDOL) 1.25 MG (50000 UNIT) CAPS capsule Take 50,000 Units by mouth once a week.     albuterol (PROVENTIL HFA;VENTOLIN HFA) 108 (90 Base) MCG/ACT inhaler Inhale 2 puffs into the lungs every 4 (four) hours as needed for wheezing or shortness of breath.     celecoxib (CELEBREX) 200 MG capsule Take 200 mg by mouth daily as needed.     EPINEPHrine 0.3 mg/0.3 mL IJ SOAJ injection Inject 0.3 mg into the skin as needed for allergies.     ibuprofen (ADVIL) 600 MG tablet Take 600 mg by mouth every 8 (eight) hours as needed for mild pain.     Current Facility-Administered Medications  Medication Dose Route Frequency Provider Last Rate Last Admin   0.9 %  sodium chloride infusion  500 mL Intravenous Once Lynann Bologna, MD        Allergies  Allergen Reactions   Alpha-Gal Other (See Comments), Itching and Shortness Of Breath   Doxycycline    Penicillins    Phenobarbital Other (See Comments)    hyperactivity   Statins Other (See Comments) and Nausea Only    Other reaction(s): Arthralgias (intolerance), Other (See Comments)   Levaquin [Levofloxacin In D5w] Rash   Sertraline Anxiety    Other reaction(s): Hypertension (intolerance)    Review of Systems:  Constitutional: Denies fever, chills, diaphoresis, appetite change and fatigue.  HEENT: Denies photophobia, eye pain, redness, hearing loss, ear pain, congestion, sore throat, rhinorrhea, sneezing, mouth sores, neck pain, neck stiffness and tinnitus.   Respiratory: Denies SOB, DOE, cough, chest tightness,  and wheezing.   Cardiovascular: Denies chest pain, palpitations and leg swelling.  Genitourinary: Denies dysuria, urgency, frequency, hematuria, flank pain and difficulty urinating.  Musculoskeletal: has myalgias, back pain, joint swelling, arthralgias and gait problem.  Skin: No rash.  Neurological: Denies dizziness, seizures, syncope, weakness, light-headedness, numbness and headaches.  Hematological: Denies  adenopathy. Easy bruising, personal or family bleeding history  Psychiatric/Behavioral: Has anxiety or depression     Physical Exam:    BP (!) 150/75   Pulse 78   Temp (!) 97.3 F (36.3 C) (Temporal)   Ht 5\' 7"  (1.702 m)   Wt 144 lb (65.3 kg)   SpO2 97%   BMI 22.55 kg/m  Wt Readings from Last 3 Encounters:  07/03/21 144 lb (65.3 kg)  07/01/21 144 lb (65.3 kg)  05/25/21 144 lb (65.3 kg)   Constitutional:  Well-developed, in no acute distress. Psychiatric: Normal mood and affect. Behavior is normal. HEENT: Pupils normal.  Conjunctivae are normal. No scleral icterus. Cardiovascular: Normal rate, regular rhythm. No edema Pulmonary/chest: Effort normal and  breath sounds normal. No wheezing, rales or rhonchi. Abdominal: Soft, nondistended. Nontender. Bowel sounds active throughout. There are no masses palpable. No hepatomegaly. Rectal: Deferred Neurological: Alert and oriented to person place and time. Skin: Skin is warm and dry. No rashes noted.    Edman Circle, MD 07/03/2021, 1:57 PM  Cc: Brown-Patram, Melissa J*

## 2021-07-05 LAB — ALPHA-GAL PANEL
Allergen Lamb IgE: 0.31 kU/L — AB
Beef IgE: 0.38 kU/L — AB
IgE (Immunoglobulin E), Serum: 19 IU/mL (ref 6–495)
O215-IgE Alpha-Gal: 0.74 kU/L — AB
Pork IgE: 0.23 kU/L — AB

## 2021-07-06 ENCOUNTER — Telehealth: Payer: Self-pay

## 2021-07-09 ENCOUNTER — Encounter: Payer: Self-pay | Admitting: Gastroenterology

## 2021-11-04 ENCOUNTER — Other Ambulatory Visit: Payer: Self-pay | Admitting: Specialist

## 2021-11-04 ENCOUNTER — Ambulatory Visit
Admission: RE | Admit: 2021-11-04 | Discharge: 2021-11-04 | Disposition: A | Discharge status: Home / Self Care | Payer: PPO | Source: Ambulatory Visit | Attending: Specialist | Admitting: Specialist

## 2021-11-04 ENCOUNTER — Ambulatory Visit: Admission: RE | Admit: 2021-11-04 | Payer: PPO | Source: Ambulatory Visit

## 2021-11-04 DIAGNOSIS — M4120 Other idiopathic scoliosis, site unspecified: Secondary | ICD-10-CM

## 2021-11-04 DIAGNOSIS — M5136 Other intervertebral disc degeneration, lumbar region: Secondary | ICD-10-CM

## 2021-11-04 DIAGNOSIS — M25551 Pain in right hip: Secondary | ICD-10-CM

## 2022-02-25 ENCOUNTER — Other Ambulatory Visit: Payer: Self-pay | Admitting: Surgery

## 2022-02-25 DIAGNOSIS — E210 Primary hyperparathyroidism: Secondary | ICD-10-CM

## 2022-02-26 ENCOUNTER — Other Ambulatory Visit: Payer: Self-pay

## 2022-02-26 DIAGNOSIS — R0989 Other specified symptoms and signs involving the circulatory and respiratory systems: Secondary | ICD-10-CM

## 2022-03-15 ENCOUNTER — Ambulatory Visit
Admission: RE | Admit: 2022-03-15 | Discharge: 2022-03-15 | Disposition: A | Discharge status: Home / Self Care | Payer: PPO | Source: Ambulatory Visit | Attending: Surgery | Admitting: Surgery

## 2022-03-15 DIAGNOSIS — E210 Primary hyperparathyroidism: Secondary | ICD-10-CM

## 2022-03-17 ENCOUNTER — Ambulatory Visit: Payer: Self-pay | Admitting: Surgery

## 2022-03-17 NOTE — Progress Notes (Signed)
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  Will need to explore both sides during surgery.  Will likely remove the one on the right, and possibly the one on the left as well.  Would still plan on outpatient surgery unless further findings during surgery.  Will send orders to schedulers to contact patient.  Will discuss further with patient when available - telephone call today and left message.  [place], MD [facility] [place] [facility] Office: [phone]

## 2022-03-31 ENCOUNTER — Ambulatory Visit: Payer: Self-pay | Admitting: Surgery

## 2022-04-20 ENCOUNTER — Encounter (HOSPITAL_COMMUNITY): Admission: RE | Admit: 2022-04-20 | Payer: PPO | Source: Ambulatory Visit

## 2022-04-30 ENCOUNTER — Ambulatory Visit: Admit: 2022-04-30 | Payer: PPO | Admitting: Surgery

## 2022-04-30 SURGERY — PARATHYROIDECTOMY
Anesthesia: General

## 2022-06-30 ENCOUNTER — Ambulatory Visit (INDEPENDENT_AMBULATORY_CARE_PROVIDER_SITE_OTHER): Payer: PPO | Admitting: Allergy and Immunology

## 2022-06-30 ENCOUNTER — Encounter: Payer: Self-pay | Admitting: Allergy and Immunology

## 2022-06-30 VITALS — BP 132/84 | HR 64 | Resp 14 | Ht 65.5 in | Wt 144.6 lb

## 2022-06-30 DIAGNOSIS — J3089 Other allergic rhinitis: Secondary | ICD-10-CM

## 2022-06-30 DIAGNOSIS — J4520 Mild intermittent asthma, uncomplicated: Secondary | ICD-10-CM | POA: Diagnosis not present

## 2022-06-30 DIAGNOSIS — T781XXD Other adverse food reactions, not elsewhere classified, subsequent encounter: Secondary | ICD-10-CM

## 2022-06-30 DIAGNOSIS — K219 Gastro-esophageal reflux disease without esophagitis: Secondary | ICD-10-CM

## 2022-06-30 DIAGNOSIS — J301 Allergic rhinitis due to pollen: Secondary | ICD-10-CM

## 2022-06-30 NOTE — Patient Instructions (Addendum)
  1.  Allergen avoidance measures -mammal consumption  2.  Continue Flonase and Zyrtec and Pepcid  3.  Continue albuterol HFA 2 inhalations every 4-6 hours if needed for lower airway issues.   4.  Blood -alpha gal panel  5.  Return to clinic in 1 year or earlier if problem  6. Plan for fall flu vaccine

## 2022-06-30 NOTE — Progress Notes (Unsigned)
[place] - [place] - [place] - [place] - [place]   Follow-up Note  Referring Provider: Rhea Bleacher* Primary Provider: Krystal Clark, NP Date of Office Visit: 06/30/2022  Subjective:   Stacy Beasley (DOB: Feb 19, 1950) is a 72 y.o. female who [facility] in re-evaluation of the following:  HPI: Stacy Beasley returns to this clinic in evaluation of alpha gal syndrome, allergic rhinitis, history of asthma, history of reflux.  I last saw her in this clinic 01 July 2021.  She has remained away from mammal consumption and has not been having any allergic reactions.  Her nose is doing quite well with some Flonase and some antihistamine.  Her reflux is doing very well using some Pepcid.  Her asthma is rarely active and her use of a short acting bronchodilators a few times a year.  Usually when she gets exposed to a chicken house she will have some difficulty.  She has been diagnosed with hyperparathyroidism and is scheduled for parathyroidectomy September 2024.  Allergies as of 06/30/2022       Reactions   Alpha-gal Other (See Comments), Itching, Shortness Of Breath   Doxycycline    Penicillins    Statins Other (See Comments), Nausea Only   Other reaction(s): Arthralgias (intolerance), Other (See Comments)   Levaquin [levofloxacin In D5w] Rash   Phenobarbital Other (See Comments), Swelling   hyperactivity   Sertraline Anxiety, Swelling, Hypertension   Other reaction(s): Hypertension (intolerance)        Medication List    albuterol 108 (90 Base) MCG/ACT inhaler Commonly known as: VENTOLIN HFA Inhale 2 puffs into the lungs every 4 (four) hours as needed for wheezing or shortness of breath.   Aspirin Low Dose 81 MG tablet Generic drug: aspirin EC Take 81 mg by mouth daily. Three times a week   celecoxib 200 MG capsule Commonly known as: CELEBREX Take 200 mg by mouth daily as needed.   cetirizine 10  MG chewable tablet Commonly known as: ZYRTEC Chew 10 mg by mouth 2 (two) times daily.   EPINEPHrine 0.3 mg/0.3 mL Soaj injection Commonly known as: EPI-PEN Inject 0.3 mg into the skin as needed for allergies.   famotidine 20 MG tablet Commonly known as: PEPCID Take 20 mg by mouth 2 (two) times daily.   fluticasone 50 MCG/ACT nasal spray Commonly known as: FLONASE Place 1 spray into both nostrils daily.   furosemide 20 MG tablet Commonly known as: LASIX Take 20 mg by mouth daily as needed.   ibuprofen 600 MG tablet Commonly known as: ADVIL Take 600 mg by mouth every 8 (eight) hours as needed for mild pain.   LORazepam 1 MG tablet Commonly known as: ATIVAN Take 0.5-1 mg by mouth every 8 (eight) hours as needed.   melatonin 3 MG Tabs tablet Take 1 tablet by mouth at bedtime.   pravastatin 20 MG tablet Commonly known as: PRAVACHOL Take 1 tablet (20 mg total) by mouth 2 (two) times a week.   Vitamin D (Ergocalciferol) 1.25 MG (50000 UNIT) Caps capsule Commonly known as: DRISDOL Take 50,000 Units by mouth once a week.    Past Medical History:  Diagnosis Date   Allergy to alpha-gal    Arthritis    Asthma    as child   Goiter, non-toxic    Hyperparathyroidism (HCC)    Hypertension    PONV (postoperative nausea and vomiting)    Seasonal allergies     Past Surgical History:  Procedure Laterality Date   arthroscopic knee  Left    BREAST EXCISIONAL BIOPSY Left 2002   BREAST SURGERY     lumpectomy   CERVICAL FUSION     CHOLECYSTECTOMY     EYE SURGERY Bilateral    cataracts   KNEE ARTHROSCOPY Right 10/21/2014   Procedure: ARTHROSCOPY KNEE;  Surgeon: Dannielle Huh, MD;  Location: [facility] OR;  Service: Orthopedics;  Laterality: Right;   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    Review of systems negative except as noted in HPI / PMHx or noted below:  Review of Systems  Constitutional: Negative.   HENT: Negative.    Eyes: Negative.   Respiratory: Negative.     Cardiovascular: Negative.   Gastrointestinal: Negative.   Genitourinary: Negative.   Musculoskeletal: Negative.   Skin: Negative.   Neurological: Negative.   Endo/Heme/Allergies: Negative.   Psychiatric/Behavioral: Negative.       Objective:   Vitals:   06/30/22 1344  BP: 132/84  Pulse: 64  Resp: 14  SpO2: 98%   Height: 5' 5.5" (166.4 cm)  Weight: 144 lb 9.6 oz (65.6 kg)   Physical Exam Constitutional:      Appearance: She is not diaphoretic.  HENT:     Head: Normocephalic.     Right Ear: Tympanic membrane, ear canal and external ear normal.     Left Ear: Tympanic membrane, ear canal and external ear normal.     Nose: Nose normal. No mucosal edema or rhinorrhea.     Mouth/Throat:     Pharynx: Uvula midline. No oropharyngeal exudate.  Eyes:     Conjunctiva/sclera: Conjunctivae normal.  Neck:     Thyroid: No thyromegaly.     Trachea: Trachea normal. No tracheal tenderness or tracheal deviation.  Cardiovascular:     Rate and Rhythm: Normal rate and regular rhythm.     Heart sounds: Normal heart sounds, S1 normal and S2 normal. No murmur heard. Pulmonary:     Effort: No respiratory distress.     Breath sounds: Normal breath sounds. No stridor. No wheezing or rales.  Lymphadenopathy:     Head:     Right side of head: No tonsillar adenopathy.     Left side of head: No tonsillar adenopathy.     Cervical: No cervical adenopathy.  Skin:    Findings: No erythema or rash.     Nails: There is no clubbing.  Neurological:     Mental Status: She is alert.     Diagnostics:   Results blood tests obtained 01 July 2021 identifies alpha gal IgE 0.74 KU/L, beef 0.23 KU/L, beeped 0.38 KU/L, lamb 0.31 KU/L  Assessment and Plan:   1. Adverse food reaction, subsequent encounter   2. Perennial allergic rhinitis   3. Seasonal allergic rhinitis due to pollen   4. Asthma, mild intermittent, well-controlled   5. Gastroesophageal reflux disease, unspecified whether esophagitis  present     1.  Allergen avoidance measures -mammal consumption  2.  Continue Flonase and Zyrtec and Pepcid  3.  Continue albuterol HFA 2 inhalations every 4-6 hours if needed for lower airway issues.   4.  Blood -alpha gal panel  5.  Return to clinic in 1 year or earlier if problem  6. Plan for fall flu vaccine  Stacy Beasley is really doing very well on her current plan which is to avoid mammal consumption and to use some anti-inflammatory agents for her upper airway and to address issues with reflux with a H2 receptor blocker.  Her last alpha-gal titer was about a third of her previous titer and we will once again recheck her alpha-gal and if it is 0 we will have her undergo a challenge with mammal consumption.  I will contact her with the results of her blood test once they are available for review.  Laurette Schimke, MD Allergy / Immunology [facility]

## 2022-07-01 ENCOUNTER — Encounter: Payer: Self-pay | Admitting: Allergy and Immunology

## 2022-07-03 LAB — ALPHA-GAL PANEL
Allergen Lamb IgE: 0.38 kU/L — AB
Beef IgE: 0.38 kU/L — AB
IgE (Immunoglobulin E), Serum: 14 IU/mL (ref 6–495)
O215-IgE Alpha-Gal: 0.6 kU/L — AB
Pork IgE: 0.27 kU/L — AB

## 2022-08-26 ENCOUNTER — Ambulatory Visit: Payer: Self-pay | Admitting: Surgery

## 2022-08-26 NOTE — H&P (Signed)
PROVIDER:  Myra Rude, MD   Chief Complaint: Follow-up (Primary hyperparathyroidism)  History of Present Illness:  Patient returns to for follow-up having undergone additional testing for evaluation of primary hyperparathyroidism. Ultrasound on March 15, 2022 demonstrates a relatively normal thyroid gland with small nodules which have either been previously biopsied or do not require further evaluation. There are 2 hypoechoic solid nodules posterior to the thyroid gland, 1 on the right and 1 on the left which may reflect bilateral parathyroid adenomas. Laboratory studies show a calcium level of 10.0, and intact PTH level of 92, and a 24-hour urine collection with a calcium level of 77. Patient returns today accompanied by her husband to discuss these results and make a decision regarding neck exploration versus further diagnostic studies.   Review of Systems: A complete review of systems was obtained from the patient. I have reviewed this information and discussed as appropriate with the patient. See HPI as well for other ROS.  Review of Systems  Constitutional: Positive for malaise/fatigue.  HENT: Negative.  Eyes: Negative.  Respiratory: Negative.  Cardiovascular: Negative.  Gastrointestinal: Negative.  Genitourinary: Negative.  Musculoskeletal: Positive for joint pain.  Skin: Negative.  Neurological: Negative.  Endo/Heme/Allergies: Negative.  Psychiatric/Behavioral: Negative.    Medical History: Past Medical History:  Diagnosis Date  Anxiety  Arthritis  Asthma, unspecified asthma severity, unspecified whether complicated, unspecified whether persistent  GERD (gastroesophageal reflux disease)  Hyperlipidemia  Hypertension  Thyroid disease   Patient Active Problem List  Diagnosis  Primary hyperparathyroidism (CMS-HCC)   Past Surgical History:  Procedure Laterality Date  Breast Biopsy Surgery 2002  Back Surgery 2005  C 5-6  CHOLECYSTECTOMY 2006  Knee  Surgery Left 2012  CATARACT EXTRACTION Bilateral 2013  JOINT REPLACEMENT Right 2022    No Known Allergies  Current Outpatient Medications on File Prior to Visit  Medication Sig Dispense Refill  albuterol 90 mcg/actuation inhaler Inhale 1-2 puffs into the lungs every 6 (six) hours as needed for Wheezing.  ergocalciferol, vitamin D2, 1,250 mcg (50,000 unit) capsule Take 50,000 Units by mouth every 7 (seven) days  famotidine (PEPCID) 20 MG tablet Take 1 tablet (20 mg total) by mouth 2 times daily.  FUROsemide (LASIX) 20 MG tablet Take 1 tablet (20 mg total) by mouth daily as needed.  LORazepam (ATIVAN) 1 MG tablet Take 1/2 to 1 tablet by mouth every 8 hours AS NEEDED for rescue/anxiety. Refills after prescription expiration require follow-up evaluation at the office.  pravastatin (PRAVACHOL) 20 MG tablet Take 1 tablet (20 mg total) by mouth daily on Monday and Thursday   No current facility-administered medications on file prior to visit.   History reviewed. No pertinent family history.   Social History   Tobacco Use  Smoking Status Former  Types: Cigarettes  Quit date: 1980  Years since quitting: 44.2  Smokeless Tobacco Never    Social History   Socioeconomic History  Marital status: Unknown  Tobacco Use  Smoking status: Former  Types: Cigarettes  Quit date: 1980  Years since quitting: 44.2  Smokeless tobacco: Never  Substance and Sexual Activity  Alcohol use: Not Currently  Drug use: Never  Sexual activity: Defer   Objective:   Vitals:  PainSc: 0-No pain   There is no height or weight on file to calculate BMI.  Physical Exam   Limited examination  Anterior cervical incision is well-healed. Palpation demonstrates no significant nodules or masses within the thyroid bed. There is no tenderness.   Assessment and Plan:  Primary hyperparathyroidism (CMS-HCC)  Patient returns today to discuss additional diagnostic testing and her results. She is accompanied by  her husband.  Today we reviewed her clinical history, her symptoms, her ultrasound results, and her laboratory results. We discussed the possibility of proceeding with a 4D CT scan of the neck for confirmation. Otherwise, I would recommend neck exploration with evaluation of both the right and left sides with removal of 1 or potentially 2 parathyroid adenomas. This would likely still be an outpatient surgical procedure. We discussed the size and location of the surgical incision. We discussed the postoperative recovery to be anticipated.  Patient would like to move forward with surgery. We answered all of her questions today. She does not wish to obtain a 4D CT scan at this time.  I will submit preop orders to the schedulers and have them contact the patient to schedule a date for outpatient surgery.  Darnell Level, MD [facility] practice Office: [phone]

## 2022-08-26 NOTE — Progress Notes (Signed)
Surgery orders requested via Epic inbox. °

## 2022-09-02 ENCOUNTER — Encounter (HOSPITAL_COMMUNITY)
Admission: RE | Admit: 2022-09-02 | Discharge: 2022-09-02 | Disposition: A | Discharge status: Home / Self Care | Payer: PPO | Source: Ambulatory Visit | Attending: Specialist | Admitting: Specialist

## 2022-09-14 ENCOUNTER — Ambulatory Visit: Admit: 2022-09-14 | Payer: PPO | Admitting: Surgery

## 2022-09-14 SURGERY — PARATHYROIDECTOMY
Anesthesia: General

## 2022-11-16 ENCOUNTER — Ambulatory Visit: Payer: Self-pay | Admitting: Surgery

## 2022-11-30 NOTE — Patient Instructions (Signed)
SURGICAL WAITING ROOM VISITATION Patients having surgery or a procedure may have no more than 2 support people in the waiting area - these visitors may rotate.    Children under the age of 45 must have an adult with them who is not the patient.  If the patient needs to stay at the hospital during part of their recovery, the visitor guidelines for inpatient rooms apply. Pre-op nurse will coordinate an appropriate time for 1 support person to accompany patient in pre-op.  This support person may not rotate.    Please refer to the [facility] website for the visitor guidelines for Inpatients (after your surgery is over and you are in a regular room).       Your procedure is scheduled on: 12-16-22   Report to [facility] Main Entrance    Report to admitting at 7:15 AM   Call this number if you have problems the morning of surgery [phone]   Do not eat food :After Midnight.   After Midnight you may have the following liquids until 6:30 AM DAY OF SURGERY  Water Non-Citrus Juices (without pulp, NO RED-Apple, White grape, White cranberry) Black Coffee (NO MILK/CREAM OR CREAMERS, sugar ok)  Clear Tea (NO MILK/CREAM OR CREAMERS, sugar ok) regular and decaf                             Plain Jell-O (NO RED)                                           Fruit ices (not with fruit pulp, NO RED)                                     Popsicles (NO RED)                                                               Sports drinks like Gatorade (NO RED)                       If you have questions, please contact your surgeon's office.   FOLLOW  ANY ADDITIONAL PRE OP INSTRUCTIONS YOU RECEIVED FROM YOUR SURGEON'S OFFICE!!!     Oral Hygiene is also important to reduce your risk of infection.                                    Remember - BRUSH YOUR TEETH THE MORNING OF SURGERY WITH YOUR REGULAR TOOTHPASTE   Do NOT smoke after Midnight   Take these medicines the morning of surgery with A SIP  OF WATER:   Zyrtec  Famotidine  Pravastatin  Okay to use inhalers and nasal spray  Stop all vitamins and herbal supplements 7 days before surgery  Bring CPAP mask and tubing day of surgery.  You may not have any metal on your body including hair pins, jewelry, and body piercing             Do not wear make-up, lotions, powders, perfumes, or deodorant  Do not wear nail polish including gel and S&S, artificial/acrylic nails, or any other type of covering on natural nails including finger and toenails. If you have artificial nails, gel coating, etc. that needs to be removed by a nail salon please have this removed prior to surgery or surgery may need to be canceled/ delayed if the surgeon/ anesthesia feels like they are unable to be safely monitored.   Do not shave  48 hours prior to surgery.    Do not bring valuables to the hospital. [facility] IS NOT RESPONSIBLE   FOR VALUABLES.   Contacts, dentures or bridgework may not be worn into surgery.  DO NOT BRING YOUR HOME MEDICATIONS TO THE HOSPITAL. PHARMACY WILL DISPENSE MEDICATIONS LISTED ON YOUR MEDICATION LIST TO YOU DURING YOUR ADMISSION IN THE HOSPITAL!    Patients discharged on the day of surgery will not be allowed to drive home.  Someone NEEDS to stay with you for the first 24 hours after anesthesia.   Special Instructions: Bring a copy of your healthcare power of attorney and living will documents the day of surgery if you haven't scanned them before.              Please read over the following fact sheets you were given: IF YOU HAVE QUESTIONS ABOUT YOUR PRE-OP INSTRUCTIONS PLEASE CALL [phone] [place]  If you received a COVID test during your pre-op visit  it is requested that you wear a mask when out in public, stay away from anyone that may not be feeling well and notify your surgeon if you develop symptoms. If you test positive for Covid or have been in contact with anyone that has tested positive  in the last 10 days please notify you surgeon.  [place] [facility] - Preparing for Surgery Before surgery, you can play an important role.  Because skin is not sterile, your skin needs to be as free of germs as possible.  You can reduce the number of germs on your skin by washing with CHG (chlorahexidine gluconate) soap before surgery.  CHG is an antiseptic cleaner which kills germs and bonds with the skin to continue killing germs even after washing. Please DO NOT use if you have an allergy to CHG or antibacterial soaps.  If your skin becomes reddened/irritated stop using the CHG and inform your nurse when you arrive at Short Stay. Do not shave (including legs and underarms) for at least 48 hours prior to the first CHG shower.  You may shave your face/neck.  Please follow these instructions carefully:  1.  Shower with CHG Soap the night before surgery and the  morning of surgery.  2.  If you choose to wash your hair, wash your hair first as usual with your normal  shampoo.  3.  After you shampoo, rinse your hair and body thoroughly to remove the shampoo.                             4.  Use CHG as you would any other liquid soap.  You can apply chg directly to the skin and wash.  Gently with a scrungie or clean washcloth.  5.  Apply the CHG Soap to your body ONLY FROM THE  NECK DOWN.   Do   not use on face/ open                           Wound or open sores. Avoid contact with eyes, ears mouth and   genitals (private parts).                       Wash face,  Genitals (private parts) with your normal soap.             6.  Wash thoroughly, paying special attention to the area where your    surgery  will be performed.  7.  Thoroughly rinse your body with warm water from the neck down.  8.  DO NOT shower/wash with your normal soap after using and rinsing off the CHG Soap.                9.  Pat yourself dry with a clean towel.            10.  Wear clean pajamas.            11.  Place clean sheets on your bed  the night of your first shower and do not  sleep with pets. Day of Surgery : Do not apply any lotions/deodorants the morning of surgery.  Please wear clean clothes to the hospital/surgery center.  FAILURE TO FOLLOW THESE INSTRUCTIONS MAY RESULT IN THE CANCELLATION OF YOUR SURGERY  PATIENT SIGNATURE_________________________________  NURSE SIGNATURE__________________________________  ________________________________________________________________________

## 2022-11-30 NOTE — Progress Notes (Signed)
COVID Vaccine Completed:  Yes  Date of COVID positive in last 90 days:  PCP - Adela Lank, NP Cardiologist - Norman Herrlich, MD  Chest x-ray -  EKG -  Stress Test -  ECHO -  Cardiac Cath -  Pacemaker/ICD device last checked: Spinal Cord Stimulator: Telemetry Monitor - 05-09-20 Epic Coronary CT - 05-05-20 Epic  Bowel Prep -   Sleep Study -  CPAP -   Fasting Blood Sugar -  Checks Blood Sugar _____ times a day  Last dose of GLP1 agonist-  N/A GLP1 instructions:  Hold 7 days before surgery    Last dose of SGLT-2 inhibitors-  N/A SGLT-2 instructions:  Hold 3 days before surgery    Blood Thinner Instructions:  Time Aspirin Instructions: Last Dose:  Activity level:  Can go up a flight of stairs and perform activities of daily living without stopping and without symptoms of chest pain or shortness of breath.  Able to exercise without symptoms  Unable to go up a flight of stairs without symptoms of     Anesthesia review:  Demand ischemia and transient global amnesia evaluated by cardiology.  HTN  Patient denies shortness of breath, fever, cough and chest pain at PAT appointment  Patient verbalized understanding of instructions that were given to them at the PAT appointment. Patient was also instructed that they will need to review over the PAT instructions again at home before surgery.

## 2022-12-02 ENCOUNTER — Encounter (HOSPITAL_COMMUNITY): Payer: Self-pay

## 2022-12-02 ENCOUNTER — Encounter (HOSPITAL_COMMUNITY)
Admission: RE | Admit: 2022-12-02 | Discharge: 2022-12-02 | Disposition: A | Discharge status: Home / Self Care | Payer: PPO | Source: Ambulatory Visit | Attending: Surgery | Admitting: Surgery

## 2022-12-02 ENCOUNTER — Other Ambulatory Visit: Payer: Self-pay

## 2022-12-02 VITALS — BP 141/79 | HR 64 | Temp 97.9°F | Resp 12 | Ht 67.0 in | Wt 134.0 lb

## 2022-12-02 DIAGNOSIS — I10 Essential (primary) hypertension: Secondary | ICD-10-CM | POA: Insufficient documentation

## 2022-12-02 DIAGNOSIS — Z01812 Encounter for preprocedural laboratory examination: Secondary | ICD-10-CM | POA: Diagnosis present

## 2022-12-02 DIAGNOSIS — Z01818 Encounter for other preprocedural examination: Secondary | ICD-10-CM | POA: Diagnosis not present

## 2022-12-02 DIAGNOSIS — Z01810 Encounter for preprocedural cardiovascular examination: Secondary | ICD-10-CM | POA: Diagnosis present

## 2022-12-02 HISTORY — DX: Anxiety disorder, unspecified: F41.9

## 2022-12-02 HISTORY — DX: Diverticulitis of intestine, part unspecified, without perforation or abscess without bleeding: K57.92

## 2022-12-02 HISTORY — DX: Basal cell carcinoma of skin, unspecified: C44.91

## 2022-12-02 LAB — BASIC METABOLIC PANEL
Anion gap: 7 (ref 5–15)
BUN: 18 mg/dL (ref 8–23)
CO2: 26 mmol/L (ref 22–32)
Calcium: 10.1 mg/dL (ref 8.9–10.3)
Chloride: 102 mmol/L (ref 98–111)
Creatinine, Ser: 0.96 mg/dL (ref 0.44–1.00)
GFR, Estimated: >60 mL/min (ref >60)
Glucose, Bld: 104 mg/dL — ABNORMAL HIGH (ref 70–99)
Potassium: 4.5 mmol/L (ref 3.5–5.1)
Sodium: 135 mmol/L (ref 135–145)

## 2022-12-02 LAB — CBC
HCT: 39.1 % (ref 36.0–46.0)
Hemoglobin: 12.9 g/dL (ref 12.0–15.0)
MCH: 32 pg (ref 26.0–34.0)
MCHC: 33 g/dL (ref 30.0–36.0)
MCV: 97 fL (ref 80.0–100.0)
Platelets: 294 10*3/uL (ref 150–400)
RBC: 4.03 MIL/uL (ref 3.87–5.11)
RDW: 12.4 % (ref 11.5–15.5)
WBC: 5.7 10*3/uL (ref 4.0–10.5)
nRBC: 0 % (ref 0.0–0.2)

## 2022-12-03 NOTE — Anesthesia Preprocedure Evaluation (Addendum)
Anesthesia Evaluation  Patient identified by MRN, date of birth, ID band Patient awake    Reviewed: Allergy & Precautions, NPO status , Patient's Chart, lab work & pertinent test results  History of Anesthesia Complications (+) PONV and history of anesthetic complications  Airway Mallampati: II  TM Distance: >3 FB Neck ROM: Full    Dental  (+) Dental Advisory Given   Pulmonary neg shortness of breath, asthma (childhood) , neg sleep apnea, neg COPD, neg recent URI, former smoker   Pulmonary exam normal breath sounds clear to auscultation       Cardiovascular hypertension, (-) angina (-) CAD, (-) Past MI, (-) Cardiac Stents and (-) CABG (-) dysrhythmias  Rhythm:Regular Rate:Normal  HLD   Neuro/Psych neg Seizures PSYCHIATRIC DISORDERS Anxiety     Transient global amnesia    GI/Hepatic Neg liver ROS,GERD  Medicated,,H/o diverticulitis   Endo/Other  neg diabetes  Primary hyperparathyroidism, non-toxic goiter  Renal/GU negative Renal ROS     Musculoskeletal  (+) Arthritis ,    Abdominal   Peds  Hematology negative hematology ROS (+) Lab Results      Component                Value               Date                      WBC                      5.7                 12/02/2022                HGB                      12.9                12/02/2022                HCT                      39.1                12/02/2022                MCV                      97.0                12/02/2022                PLT                      294                 12/02/2022              Anesthesia Other Findings Alpha-gal allergy  Reproductive/Obstetrics                             Anesthesia Physical Anesthesia Plan  ASA: 2  Anesthesia Plan: General   Post-op Pain Management:    Induction: Intravenous  PONV Risk Score and Plan: 4 or greater and Ondansetron, Dexamethasone and Treatment may vary due to age  or medical condition  Airway Management Planned: Oral  ETT  Additional Equipment:   Intra-op Plan:   Post-operative Plan: Extubation in OR  Informed Consent: I have reviewed the patients History and Physical, chart, labs and discussed the procedure including the risks, benefits and alternatives for the proposed anesthesia with the patient or authorized representative who has indicated his/her understanding and acceptance.     Dental advisory given  Plan Discussed with: Anesthesiologist and CRNA  Anesthesia Plan Comments: (See PAT note from 11/21 by K Gekas PA-C  Risks of general anesthesia discussed including, but not limited to, sore throat, hoarse voice, chipped/damaged teeth, injury to vocal cords, nausea and vomiting, allergic reactions, lung infection, heart attack, stroke, and death. All questions answered.  )        Anesthesia Quick Evaluation

## 2022-12-03 NOTE — Progress Notes (Signed)
DISCUSSION: Stacy Beasley is a 72 yo female who presents to PAT prior to NECK EXPLORATION W/PARATHYROIDECTOMY on 12/16/22 with Dr. Gerrit Friends. PMH of former smoking, HTN, asthma, GERD, alpha-gal allergy, anxiety.  Prior anesthesia complications include PONV  Patient was evaluated by Cardiology in 2022 when she was admitted for hypertensive urgency with elevated troponins. EKG was without ischemic changes. Echo was did not show segmental left ventricular dysfunction. 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 Per Dr. Dulce Sellar during OV in 2022: "Fortunately her cardiac CTA is normal calcium score 0 no CAD which she does have aortic atherosclerosis not continue her low intensity statin with a neurologic event. Her blood pressure is now well controlled on low-dose thiazide diuretic I gave her a prescription for an ARB to take as needed. I will plan to see her back in my office as needed."  She follows with allergy and asthma clinic for her asthma which is controlled on inhalers. She is recommended to avoid all mammal consumption due to lab confirmed alpha gal allergy  VS: BP (!) 141/79   Pulse 64   Temp 36.6 C (Oral)   Resp 12   Ht 5\' 7"  (1.702 m)   Wt 60.8 kg   SpO2 99%   BMI 20.99 kg/m   PROVIDERS: Brown-Patram, Servando Salina, NP Cardiologist - Norman Herrlich, MD  LABS: Labs reviewed: Acceptable for surgery. (all labs ordered are listed, but only abnormal results are displayed)  Labs Reviewed  BASIC METABOLIC PANEL - Abnormal; Notable for the following components:      Result Value   Glucose, Bld 104 (*)    All other components within normal limits  CBC     IMAGES:   EKG 12/02/22  Normal sinus rhythm, rate 67 Possible Left atrial enlargement  CV:  Holter monitor 05/09/2020:  Patch Wear Time:  14 days and 0 hours (2022-04-08T13:38:11-398 to 2022-04-22T13:38:11-398)   Patient had a min HR of 41 bpm, max HR of 96 bpm, and avg HR of 61 bpm.  Predominant underlying rhythm was Sinus Rhythm. Ectopic Atrial Rhythm was present. Isolated SVEs were rare (<1.0%), SVE Couplets were rare (<1.0%), and SVE Triplets were rare  (<1.0%). Isolated VEs were rare (<1.0%), and no VE Couplets or VE Triplets were present.   There are no pauses of 3 seconds or greater and no episodes of second or third-degree AV nodal block or sinus node exit block. Ventricular ectopy was rare. Supraventricular ectopy was rare there were no episodes of atrial fibrillation or flutter.  Occasional ectopic atrial rhythm is noted with a second P wave morphology. There were no symptomatic events.   Conclusion unremarkable event monitor.  CTA Coronaries 05/05/2020:  IMPRESSION: 1. Coronary calcium score of 0.   2. Normal coronary origin with right dominance.   3. Normal coronary arteries.   RECOMMENDATIONS: 1. No evidence of CAD (0%). Consider non-atherosclerotic causes of chest pain.    Past Medical History:  Diagnosis Date   Allergy to alpha-gal    Anxiety    Arthritis    Asthma    as child   Basal cell carcinoma of skin    R leg   Diverticulitis    Goiter, non-toxic    Hyperparathyroidism (HCC)    Hypertension    PONV (postoperative nausea and vomiting)    Seasonal allergies     Past Surgical History:  Procedure Laterality Date   arthroscopic knee  Left    BREAST EXCISIONAL BIOPSY Left 2002  BREAST SURGERY     lumpectomy   CERVICAL FUSION     CHOLECYSTECTOMY     EYE SURGERY Bilateral    cataracts   KNEE ARTHROSCOPY Right 10/21/2014   Procedure: ARTHROSCOPY KNEE;  Surgeon: Dannielle Huh, MD;  [facility]: [facility] OR;  Service: Orthopedics;  Laterality: Right;   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   SKIN CANCER EXCISION      MEDICATIONS:  albuterol (PROVENTIL HFA;VENTOLIN HFA) 108 (90 Base) MCG/ACT inhaler   ASPIRIN LOW DOSE 81 MG EC tablet   celecoxib (CELEBREX) 100 MG capsule   cetirizine (ZYRTEC) 10 MG chewable tablet   EPINEPHrine 0.3  mg/0.3 mL IJ SOAJ injection   famotidine (PEPCID) 20 MG tablet   fluticasone (FLONASE) 50 MCG/ACT nasal spray   furosemide (LASIX) 20 MG tablet   LORazepam (ATIVAN) 1 MG tablet   Melatonin 3 MG TABS   pravastatin (PRAVACHOL) 20 MG tablet   Vitamin D, Ergocalciferol, (DRISDOL) 1.25 MG (50000 UNIT) CAPS capsule   No current facility-administered medications for this encounter.   Marcille Blanco [facility] Surgical Short Stay/Anesthesiology [facility] Phone [phone] 12/03/2022 1:17 PM

## 2022-12-13 ENCOUNTER — Encounter (HOSPITAL_COMMUNITY): Payer: Self-pay | Admitting: Surgery

## 2022-12-13 DIAGNOSIS — E210 Primary hyperparathyroidism: Secondary | ICD-10-CM | POA: Diagnosis present

## 2022-12-13 NOTE — H&P (Signed)
PROVIDER:  Myra Rude, MD   Chief Complaint: Follow-up (Primary hyperparathyroidism)  History of Present Illness:  Patient returns to for follow-up having undergone additional testing for evaluation of primary hyperparathyroidism. Ultrasound on March 15, 2022 demonstrates a relatively normal thyroid gland with small nodules which have either been previously biopsied or do not require further evaluation. There are 2 hypoechoic solid nodules posterior to the thyroid gland, 1 on the right and 1 on the left which may reflect bilateral parathyroid adenomas. Laboratory studies show a calcium level of 10.0, and intact PTH level of 92, and a 24-hour urine collection with a calcium level of 77. Patient returns today accompanied by her husband to discuss these results and make a decision regarding neck exploration versus further diagnostic studies.   Review of Systems: A complete review of systems was obtained from the patient. I have reviewed this information and discussed as appropriate with the patient. See HPI as well for other ROS.  Review of Systems  Constitutional: Positive for malaise/fatigue.  HENT: Negative.  Eyes: Negative.  Respiratory: Negative.  Cardiovascular: Negative.  Gastrointestinal: Negative.  Genitourinary: Negative.  Musculoskeletal: Positive for joint pain.  Skin: Negative.  Neurological: Negative.  Endo/Heme/Allergies: Negative.  Psychiatric/Behavioral: Negative.    Medical History: Past Medical History:  Diagnosis Date  Anxiety  Arthritis  Asthma, unspecified asthma severity, unspecified whether complicated, unspecified whether persistent  GERD (gastroesophageal reflux disease)  Hyperlipidemia  Hypertension  Thyroid disease   Patient Active Problem List  Diagnosis  Primary hyperparathyroidism (CMS-HCC)   Past Surgical History:  Procedure Laterality Date  Breast Biopsy Surgery 2002  Back Surgery 2005  C 5-6  CHOLECYSTECTOMY 2006  Knee  Surgery Left 2012  CATARACT EXTRACTION Bilateral 2013  JOINT REPLACEMENT Right 2022    No Known Allergies  Current Outpatient Medications on File Prior to Visit  Medication Sig Dispense Refill  albuterol 90 mcg/actuation inhaler Inhale 1-2 puffs into the lungs every 6 (six) hours as needed for Wheezing.  ergocalciferol, vitamin D2, 1,250 mcg (50,000 unit) capsule Take 50,000 Units by mouth every 7 (seven) days  famotidine (PEPCID) 20 MG tablet Take 1 tablet (20 mg total) by mouth 2 times daily.  FUROsemide (LASIX) 20 MG tablet Take 1 tablet (20 mg total) by mouth daily as needed.  LORazepam (ATIVAN) 1 MG tablet Take 1/2 to 1 tablet by mouth every 8 hours AS NEEDED for rescue/anxiety. Refills after prescription expiration require follow-up evaluation at the office.  pravastatin (PRAVACHOL) 20 MG tablet Take 1 tablet (20 mg total) by mouth daily on Monday and Thursday   No current facility-administered medications on file prior to visit.   History reviewed. No pertinent family history.   Social History   Tobacco Use  Smoking Status Former  Types: Cigarettes  Quit date: 1980  Years since quitting: 44.2  Smokeless Tobacco Never    Social History   Socioeconomic History  Marital status: Unknown  Tobacco Use  Smoking status: Former  Types: Cigarettes  Quit date: 1980  Years since quitting: 44.2  Smokeless tobacco: Never  Substance and Sexual Activity  Alcohol use: Not Currently  Drug use: Never  Sexual activity: Defer   Objective:   Vitals:  PainSc: 0-No pain   There is no height or weight on file to calculate BMI.  Physical Exam   Limited examination  Anterior cervical incision is well-healed. Palpation demonstrates no significant nodules or masses within the thyroid bed. There is no tenderness.   Assessment and Plan:  Primary hyperparathyroidism (CMS-HCC)  Patient returns today to discuss additional diagnostic testing and her results. She is accompanied by  her husband.  Today we reviewed her clinical history, her symptoms, her ultrasound results, and her laboratory results. We discussed the possibility of proceeding with a 4D CT scan of the neck for confirmation. Otherwise, I would recommend neck exploration with evaluation of both the right and left sides with removal of 1 or potentially 2 parathyroid adenomas. This would likely still be an outpatient surgical procedure. We discussed the size and location of the surgical incision. We discussed the postoperative recovery to be anticipated.  Patient would like to move forward with surgery. We answered all of her questions today. She does not wish to obtain a 4D CT scan at this time.  I will submit preop orders to the schedulers and have them contact the patient to schedule a date for outpatient surgery.  Darnell Level, MD [facility] practice Office: [phone]

## 2022-12-16 ENCOUNTER — Encounter (HOSPITAL_COMMUNITY)
Admission: RE | Disposition: A | Discharge status: Home / Self Care | Payer: Self-pay | Source: Home / Self Care | Attending: Surgery

## 2022-12-16 ENCOUNTER — Other Ambulatory Visit (HOSPITAL_COMMUNITY): Payer: Self-pay

## 2022-12-16 ENCOUNTER — Encounter (HOSPITAL_COMMUNITY): Payer: Self-pay | Admitting: Surgery

## 2022-12-16 ENCOUNTER — Ambulatory Visit (HOSPITAL_COMMUNITY): Payer: PPO | Admitting: Anesthesiology

## 2022-12-16 ENCOUNTER — Other Ambulatory Visit: Payer: Self-pay

## 2022-12-16 ENCOUNTER — Ambulatory Visit (HOSPITAL_COMMUNITY)
Admission: RE | Admit: 2022-12-16 | Discharge: 2022-12-16 | Disposition: A | Discharge status: Home / Self Care | Payer: PPO | Attending: Surgery | Admitting: Surgery

## 2022-12-16 ENCOUNTER — Ambulatory Visit (HOSPITAL_COMMUNITY): Payer: PPO | Admitting: Medical

## 2022-12-16 DIAGNOSIS — I10 Essential (primary) hypertension: Secondary | ICD-10-CM | POA: Insufficient documentation

## 2022-12-16 DIAGNOSIS — G454 Transient global amnesia: Secondary | ICD-10-CM | POA: Diagnosis not present

## 2022-12-16 DIAGNOSIS — Z79899 Other long term (current) drug therapy: Secondary | ICD-10-CM | POA: Diagnosis not present

## 2022-12-16 DIAGNOSIS — E210 Primary hyperparathyroidism: Secondary | ICD-10-CM

## 2022-12-16 DIAGNOSIS — Z87891 Personal history of nicotine dependence: Secondary | ICD-10-CM | POA: Diagnosis not present

## 2022-12-16 DIAGNOSIS — E785 Hyperlipidemia, unspecified: Secondary | ICD-10-CM | POA: Insufficient documentation

## 2022-12-16 DIAGNOSIS — K219 Gastro-esophageal reflux disease without esophagitis: Secondary | ICD-10-CM | POA: Diagnosis not present

## 2022-12-16 HISTORY — PX: PARATHYROIDECTOMY: SHX19

## 2022-12-16 SURGERY — PARATHYROIDECTOMY
Anesthesia: General

## 2022-12-16 MED ORDER — LIDOCAINE HCL (CARDIAC) PF 100 MG/5ML IV SOSY
PREFILLED_SYRINGE | INTRAVENOUS | Status: DC | PRN
  Administered 2022-12-16: 80 mg via INTRAVENOUS

## 2022-12-16 MED ORDER — DEXAMETHASONE SODIUM PHOSPHATE 10 MG/ML IJ SOLN
INTRAMUSCULAR | Status: AC
  Filled 2022-12-16: qty 1

## 2022-12-16 MED ORDER — BUPIVACAINE HCL 0.25 % IJ SOLN
INTRAMUSCULAR | Status: DC | PRN
  Administered 2022-12-16: 9 mL

## 2022-12-16 MED ORDER — CHLORHEXIDINE GLUCONATE CLOTH 2 % EX PADS: 6.0000 | MEDICATED_PAD | Freq: Once | CUTANEOUS | Status: DC

## 2022-12-16 MED ORDER — ONDANSETRON HCL 4 MG/2ML IJ SOLN
4.0000 mg | Freq: Once | INTRAMUSCULAR | Status: AC | PRN
  Administered 2022-12-16: 4 mg via INTRAVENOUS

## 2022-12-16 MED ORDER — AMISULPRIDE (ANTIEMETIC) 5 MG/2ML IV SOLN
10.0000 mg | Freq: Once | INTRAVENOUS | Status: AC | PRN
  Administered 2022-12-16: 10 mg via INTRAVENOUS

## 2022-12-16 MED ORDER — FENTANYL CITRATE (PF) 100 MCG/2ML IJ SOLN
INTRAMUSCULAR | Status: AC
  Filled 2022-12-16: qty 2

## 2022-12-16 MED ORDER — CHLORHEXIDINE GLUCONATE 0.12 % MT SOLN
15.0000 mL | Freq: Once | OROMUCOSAL | Status: AC
  Administered 2022-12-16: 15 mL via OROMUCOSAL

## 2022-12-16 MED ORDER — LACTATED RINGERS IV SOLN: INTRAVENOUS | Status: DC | PRN

## 2022-12-16 MED ORDER — ROCURONIUM BROMIDE 100 MG/10ML IV SOLN
INTRAVENOUS | Status: DC | PRN
  Administered 2022-12-16: 40 mg via INTRAVENOUS

## 2022-12-16 MED ORDER — FENTANYL CITRATE PF 50 MCG/ML IJ SOSY
PREFILLED_SYRINGE | INTRAMUSCULAR | Status: AC
  Administered 2022-12-16: 25 ug via INTRAVENOUS
  Filled 2022-12-16: qty 1

## 2022-12-16 MED ORDER — AMISULPRIDE (ANTIEMETIC) 5 MG/2ML IV SOLN
INTRAVENOUS | Status: AC
  Filled 2022-12-16: qty 4

## 2022-12-16 MED ORDER — MIDAZOLAM HCL 5 MG/5ML IJ SOLN
INTRAMUSCULAR | Status: DC | PRN
  Administered 2022-12-16: 2 mg via INTRAVENOUS

## 2022-12-16 MED ORDER — ONDANSETRON HCL 4 MG/2ML IJ SOLN
INTRAMUSCULAR | Status: AC
  Filled 2022-12-16: qty 2

## 2022-12-16 MED ORDER — ONDANSETRON HCL 4 MG/2ML IJ SOLN
INTRAMUSCULAR | Status: DC | PRN
  Administered 2022-12-16: 4 mg via INTRAVENOUS

## 2022-12-16 MED ORDER — FENTANYL CITRATE PF 50 MCG/ML IJ SOSY
25.0000 ug | PREFILLED_SYRINGE | INTRAMUSCULAR | Status: DC | PRN
Start: 2022-12-16 — End: 2022-12-16

## 2022-12-16 MED ORDER — LIDOCAINE HCL (PF) 2 % IJ SOLN
INTRAMUSCULAR | Status: AC
Start: 2022-12-16 — End: ?
  Filled 2022-12-16: qty 5

## 2022-12-16 MED ORDER — ONDANSETRON HCL 4 MG/2ML IJ SOLN: INTRAMUSCULAR | Status: DC | PRN

## 2022-12-16 MED ORDER — DEXAMETHASONE SODIUM PHOSPHATE 10 MG/ML IJ SOLN
INTRAMUSCULAR | Status: DC | PRN
  Administered 2022-12-16: 10 mg via INTRAVENOUS

## 2022-12-16 MED ORDER — SODIUM CHLORIDE 0.9 % IV SOLN: INTRAVENOUS | Status: DC | PRN

## 2022-12-16 MED ORDER — FENTANYL CITRATE (PF) 100 MCG/2ML IJ SOLN
INTRAMUSCULAR | Status: DC | PRN
  Administered 2022-12-16 (×2): 50 ug via INTRAVENOUS

## 2022-12-16 MED ORDER — 0.9 % SODIUM CHLORIDE (POUR BTL) OPTIME
TOPICAL | Status: DC | PRN
  Administered 2022-12-16: 1000 mL

## 2022-12-16 MED ORDER — HEMOSTATIC AGENTS (NO CHARGE) OPTIME
TOPICAL | Status: DC | PRN
  Administered 2022-12-16: 1 via TOPICAL

## 2022-12-16 MED ORDER — VANCOMYCIN HCL IN DEXTROSE 1-5 GM/200ML-% IV SOLN: 1000.0000 mg | INTRAVENOUS | Status: DC

## 2022-12-16 MED ORDER — ROCURONIUM BROMIDE 10 MG/ML (PF) SYRINGE
PREFILLED_SYRINGE | INTRAVENOUS | Status: AC
  Filled 2022-12-16: qty 10

## 2022-12-16 MED ORDER — ORAL CARE MOUTH RINSE: 15.0000 mL | Freq: Once | OROMUCOSAL | Status: AC

## 2022-12-16 MED ORDER — SUGAMMADEX SODIUM 200 MG/2ML IV SOLN
INTRAVENOUS | Status: DC | PRN
  Administered 2022-12-16: 200 mg via INTRAVENOUS

## 2022-12-16 MED ORDER — MIDAZOLAM HCL 2 MG/2ML IJ SOLN
INTRAMUSCULAR | Status: AC
  Filled 2022-12-16: qty 2

## 2022-12-16 MED ORDER — EPHEDRINE 5 MG/ML INJ
INTRAVENOUS | Status: AC
  Filled 2022-12-16: qty 5

## 2022-12-16 MED ORDER — TRAMADOL HCL 50 MG PO TABS
50.0000 mg | ORAL_TABLET | Freq: Four times a day (QID) | ORAL | 0 refills | Status: DC | PRN
  Filled 2022-12-16 (×2): qty 12, 3d supply, fill #0

## 2022-12-16 MED ORDER — PROPOFOL 10 MG/ML IV BOLUS
INTRAVENOUS | Status: DC | PRN
  Administered 2022-12-16: 100 mg via INTRAVENOUS

## 2022-12-16 MED ORDER — PROPOFOL 10 MG/ML IV BOLUS
INTRAVENOUS | Status: AC
  Filled 2022-12-16: qty 20

## 2022-12-16 MED ORDER — BUPIVACAINE HCL 0.25 % IJ SOLN
INTRAMUSCULAR | Status: AC
  Filled 2022-12-16: qty 1

## 2022-12-16 MED ORDER — CEFAZOLIN SODIUM-DEXTROSE 2-4 GM/100ML-% IV SOLN
2.0000 g | Freq: Once | INTRAVENOUS | Status: AC
  Administered 2022-12-16: 2 g via INTRAVENOUS
  Filled 2022-12-16: qty 100

## 2022-12-16 SURGICAL SUPPLY — 31 items
ATTRACTOMAT 16X20 MAGNETIC DRP (DRAPES) ×1 IMPLANT
BAG COUNTER SPONGE SURGICOUNT (BAG) ×1 IMPLANT
BLADE SURG 15 STRL LF DISP TIS (BLADE) ×1 IMPLANT
CHLORAPREP W/TINT 26 (MISCELLANEOUS) ×1 IMPLANT
CLIP TI MEDIUM 6 (CLIP) ×2 IMPLANT
CLIP TI WIDE RED SMALL 6 (CLIP) ×2 IMPLANT
COVER SURGICAL LIGHT HANDLE (MISCELLANEOUS) ×1 IMPLANT
DERMABOND ADVANCED .7 DNX12 (GAUZE/BANDAGES/DRESSINGS) ×1 IMPLANT
DRAPE LAPAROTOMY T 98X78 PEDS (DRAPES) ×1 IMPLANT
DRAPE UTILITY XL STRL (DRAPES) ×1 IMPLANT
ELECT REM PT RETURN 15FT ADLT (MISCELLANEOUS) ×1 IMPLANT
GAUZE 4X4 16PLY ~~LOC~~+RFID DBL (SPONGE) ×1 IMPLANT
GLOVE SURG ORTHO 8.0 STRL STRW (GLOVE) ×1 IMPLANT
GOWN STRL REUS W/ TWL XL LVL3 (GOWN DISPOSABLE) ×3 IMPLANT
HEMOSTAT SURGICEL 2X4 FIBR (HEMOSTASIS) ×1 IMPLANT
ILLUMINATOR WAVEGUIDE N/F (MISCELLANEOUS) IMPLANT
KIT BASIN OR (CUSTOM PROCEDURE TRAY) ×1 IMPLANT
KIT TURNOVER KIT A (KITS) IMPLANT
NDL HYPO 22X1.5 SAFETY MO (MISCELLANEOUS) ×1 IMPLANT
NEEDLE HYPO 22X1.5 SAFETY MO (MISCELLANEOUS) ×1
PACK BASIC VI WITH GOWN DISP (CUSTOM PROCEDURE TRAY) ×1 IMPLANT
PENCIL SMOKE EVACUATOR (MISCELLANEOUS) ×1 IMPLANT
SHEARS HARMONIC 9CM CVD (BLADE) IMPLANT
SUT MNCRL AB 4-0 PS2 18 (SUTURE) ×1 IMPLANT
SUT SILK 2-0 18XBRD TIE 12 (SUTURE) IMPLANT
SUT VIC AB 3-0 SH 18 (SUTURE) ×1 IMPLANT
SYR BULB IRRIG 60ML STRL (SYRINGE) ×1 IMPLANT
SYR CONTROL 10ML LL (SYRINGE) ×1 IMPLANT
TOWEL OR 17X26 10 PK STRL BLUE (TOWEL DISPOSABLE) ×1 IMPLANT
TOWEL OR NON WOVEN STRL DISP B (DISPOSABLE) ×1 IMPLANT
TUBING CONNECTING 10 (TUBING) ×1 IMPLANT

## 2022-12-16 NOTE — Discharge Instructions (Addendum)

## 2022-12-16 NOTE — Op Note (Signed)
Operative Note  Pre-operative Diagnosis:  primary hyperparathyroidism  Post-operative Diagnosis:  same  Surgeon:  Darnell Level, MD  Assistant:  none   Procedure:  neck exploration, right superior parathyroidectomy, left superior parathyroidectomy  Anesthesia:  general  Estimated Blood Loss:  10 cc  Drains: none         Specimen: parathyroid tissue to pathology  Indications:  Patient returns to for follow-up having undergone additional testing for evaluation of primary hyperparathyroidism. Ultrasound on March 15, 2022 demonstrates a relatively normal thyroid gland with small nodules which have either been previously biopsied or do not require further evaluation. There are 2 hypoechoic solid nodules posterior to the thyroid gland, 1 on the right and 1 on the left which may reflect bilateral parathyroid adenomas. Laboratory studies show a calcium level of 10.0, and intact PTH level of 92, and a 24-hour urine collection with a calcium level of 77.   Patient now comes to surgery for neck exploration and parathyroidectomy.  Procedure:  The patient was seen in the pre-op holding area. The risks, benefits, complications, treatment options, and expected outcomes were previously discussed with the patient. The patient agreed with the proposed plan and has signed the informed consent form.  The patient was brought to the operating room by the surgical team, identified as Stacy Beasley and the procedure verified. A "time out" was completed and the above information confirmed.  Following administration of general endotracheal anesthesia, the patient was positioned and then prepped and draped in the usual aseptic fashion.  After ascertaining that an adequate level of anesthesia been achieved, a small central Kocher incision is made with a #15 blade.  Dissection is carried through subcutaneous tissues and hemostasis achieved with the electrocautery.  Skin flaps are elevated cephalad and caudad.  A  self-retaining retractors placed for exposure.  Strap muscles are incised in the midline.  Dissection has begun on the left side.  Left thyroid lobe was mobilized using the harmonic scalpel for hemostasis.  Posterior to the left lobe and enlarged left superior parathyroid gland is identified.  Further dissection reveals what appears to be a normal left inferior parathyroid gland.  Both glands are left in situ while the right side is explored.  Strap muscles are reflected to the right exposing the right thyroid lobe.  Right lobe is gently dissected out.  Further dissection posteriorly reveals an enlarged parathyroid gland which appears to originate from the superior position and is quite posterior line along the precervical fascia.  It is large measuring approximately 3 cm in length.  It is gently dissected out and mobilized away from the esophagus.  Recurrent nerve is identified and preserved.  Vascular structures are divided between small ligaclips and the gland is excised completely.  It is submitted to pathology where frozen section confirms hypercellular parathyroid tissue consistent with adenoma.  Further dissection on the right side reveals what appears to be a normal right inferior parathyroid gland which is left in situ.  Next we returned to the left side.  A biopsy of the left superior parathyroid gland was taken using a medium Ligaclip.  This was submitted to pathology where frozen section confirmed hypercellular parathyroid tissue possibly consistent with adenoma.  I decision was made then to resect the remainder of the left superior gland.  It was mobilized.  Vascular structures were divided between small ligaclips and the gland was excised.  It is submitted to pathology labeled as remainder of left superior parathyroid gland.  Neck is irrigated  with warm saline.  Good hemostasis is noted throughout the operative field.  Fibrillar was placed throughout the operative field.  Strap muscles are  reapproximated in the midline with interrupted 3-0 Vicryl sutures.  Platysma was closed with interrupted 3-0 Vicryl sutures.  Skin is anesthetized with local anesthetic.  Skin edges are closed with a running 4-0 Monocryl subcuticular suture.  Wound is washed and dried and Dermabond is applied as dressing.  Patient is awakened from anesthesia and transferred to the recovery room in stable condition.  The patient tolerated the procedure well.   Darnell Level, MD [facility] Office: [phone]

## 2022-12-16 NOTE — Anesthesia Procedure Notes (Signed)
Procedure Name: Intubation Date/Time: 12/16/2022 9:30 AM  Performed by: [place], Clydene Pugh, CRNAPre-anesthesia Checklist: Patient identified, Emergency Drugs available, Suction available and Patient being monitored Patient Re-evaluated:Patient Re-evaluated prior to induction Oxygen Delivery Method: Circle system utilized Preoxygenation: Pre-oxygenation with 100% oxygen Induction Type: IV induction Ventilation: Mask ventilation without difficulty Laryngoscope Size: Mac and 3 Grade View: Grade II Tube type: Oral Tube size: 7.0 mm Number of attempts: 1 Airway Equipment and Method: Stylet and Oral airway Placement Confirmation: ETT inserted through vocal cords under direct vision, positive ETCO2 and breath sounds checked- equal and bilateral Secured at: 20 cm Tube secured with: Tape Dental Injury: Teeth and Oropharynx as per pre-operative assessment

## 2022-12-16 NOTE — Transfer of Care (Signed)
Immediate Anesthesia Transfer of Care Note  Patient: Stacy Beasley  Procedure(s) Performed: NECK EXPLORATION LEFT SUPERIOR PARATHYROIDECTOMY AND RIGHT SUPERIOR PARATHYROIDECTOMY  Patient Location: PACU  Anesthesia Type:General  Level of Consciousness: awake, alert , and oriented  Airway & Oxygen Therapy: Patient Spontanous Breathing  Post-op Assessment: Report given to RN and Post -op Vital signs reviewed and stable  Post vital signs: Reviewed and stable  Last Vitals:  Vitals Value Taken Time  BP 155/78 12/16/22 1135  Temp 36.7 C 12/16/22 1135  Pulse 70 12/16/22 1136  Resp 18 12/16/22 1136  SpO2 94 % 12/16/22 1136  Vitals shown include unfiled device data.  Last Pain:  Vitals:   12/16/22 0827  TempSrc:   PainSc: 0-No pain         Complications: No notable events documented.

## 2022-12-16 NOTE — Anesthesia Postprocedure Evaluation (Signed)
Anesthesia Post Note  Patient: Stacy Beasley  Procedure(s) Performed: NECK EXPLORATION LEFT SUPERIOR PARATHYROIDECTOMY AND RIGHT SUPERIOR PARATHYROIDECTOMY     Patient location during evaluation: PACU Anesthesia Type: General Level of consciousness: awake Pain management: pain level controlled Vital Signs Assessment: post-procedure vital signs reviewed and stable Respiratory status: spontaneous breathing, nonlabored ventilation and respiratory function stable Cardiovascular status: blood pressure returned to baseline and stable Postop Assessment: no apparent nausea or vomiting Anesthetic complications: no   No notable events documented.  Last Vitals:  Vitals:   12/16/22 1215 12/16/22 1230  BP: (!) 158/86 (!) 163/91  Pulse: 70 73  Resp: 13 16  Temp:  36.6 C  SpO2: 95% 96%    Last Pain:  Vitals:   12/16/22 1230  TempSrc:   PainSc: 2                  Linton Rump

## 2022-12-16 NOTE — Interval H&P Note (Signed)
History and Physical Interval Note:  12/16/2022 8:53 AM  Stacy Beasley  has presented today for surgery, with the diagnosis of PRIMARY HYPERPARATHYROIDISM.  The various methods of treatment have been discussed with the patient and family. After consideration of risks, benefits and other options for treatment, the patient has consented to    Procedure(s): NECK EXPLORATION W/PARATHYROIDECTOMY (N/A) as a surgical intervention.    The patient's history has been reviewed, patient examined, no change in status, stable for surgery.  I have reviewed the patient's chart and labs.  Questions were answered to the patient's satisfaction.    Darnell Level, MD [facility] practice Office: [phone]   Darnell Level

## 2022-12-17 ENCOUNTER — Encounter (HOSPITAL_COMMUNITY): Payer: Self-pay | Admitting: Surgery

## 2022-12-17 LAB — SURGICAL PATHOLOGY

## 2023-08-15 NOTE — Progress Notes (Addendum)
 Vitamin B12 1.0cc given IM Left Deltoid.  Patient tolerated the injection well with no complaints. See MAR for complete documentation. Patram Melissa Joyce Brown Patram, NP

## 2023-09-22 ENCOUNTER — Ambulatory Visit: Admitting: Allergy and Immunology

## 2023-09-22 ENCOUNTER — Encounter: Payer: Self-pay | Admitting: Allergy and Immunology

## 2023-09-22 VITALS — BP 164/92 | HR 60 | Resp 12 | Ht 65.0 in | Wt 138.2 lb

## 2023-09-22 DIAGNOSIS — J4520 Mild intermittent asthma, uncomplicated: Secondary | ICD-10-CM | POA: Diagnosis not present

## 2023-09-22 DIAGNOSIS — K219 Gastro-esophageal reflux disease without esophagitis: Secondary | ICD-10-CM

## 2023-09-22 DIAGNOSIS — J301 Allergic rhinitis due to pollen: Secondary | ICD-10-CM

## 2023-09-22 DIAGNOSIS — T781XXD Other adverse food reactions, not elsewhere classified, subsequent encounter: Secondary | ICD-10-CM | POA: Diagnosis not present

## 2023-09-22 DIAGNOSIS — J3089 Other allergic rhinitis: Secondary | ICD-10-CM

## 2023-09-22 NOTE — Progress Notes (Signed)
 [place] - [place] - [place] - [place] [facility] [place]   Follow-up Note  Referring Provider: Benson Eleanor PARAS* Primary Provider: Benson Eleanor Rung, NP Date of Office Visit: 09/22/2023  Subjective:   Stacy Beasley (DOB: [birth year]) is a 72 y.o. female who [facility] in re-evaluation of the following:  HPI: [name] returns to this clinic in reevaluation of alpha gal syndrome, allergic rhinitis, distant history of asthma, reflux.  I last saw her in this clinic 30 June 2022.  She remains without mammal consumption and has not had any allergic reactions and has not had to use an injectable epinephrine device.  Her nose is doing quite well while consistently using a nasal steroid and a oral antihistamine.  Her reflux is doing quite well while using famotidine.  She has had no problems with her asthma and does not need to use a short acting bronchodilator more than twice a year.  Allergies as of 09/22/2023       Reactions   Alpha-gal Other (See Comments), Itching, Shortness Of Breath   Doxycycline    Macrobid [nitrofurantoin] Diarrhea   Nitrofurantoin Macrocrystal Diarrhea   Abdominal pain   Statins Other (See Comments), Nausea Only   Other reaction(s): Arthralgias (intolerance), Other (See Comments)   Levaquin [levofloxacin In D5w] Rash   Penicillins Rash   Childhood reaction   Phenobarbital Other (See Comments), Swelling   hyperactivity   Sertraline Anxiety, Swelling, Hypertension   Other reaction(s): Hypertension (intolerance)        Medication List    albuterol 108 (90 Base) MCG/ACT inhaler Commonly known as: VENTOLIN HFA Inhale 2 puffs into the lungs every 4 (four) hours as needed for wheezing or shortness of breath.   Aspirin Low Dose 81 MG tablet Generic drug: aspirin EC Take 81 mg by mouth 2 (two) times a week.   celecoxib 100 MG capsule Commonly known as: CELEBREX Take 100 mg by mouth 2 (two)  times daily as needed for moderate pain (pain score 4-6).   cetirizine 10 MG chewable tablet Commonly known as: ZYRTEC Chew 10 mg by mouth 2 (two) times daily.   EPINEPHrine 0.3 mg/0.3 mL Soaj injection Commonly known as: EPI-PEN Inject 0.3 mg into the skin as needed for allergies.   famotidine 20 MG tablet Commonly known as: PEPCID Take 20 mg by mouth 2 (two) times daily.   fluticasone 50 MCG/ACT nasal spray Commonly known as: FLONASE Place 1 spray into both nostrils daily.   furosemide 20 MG tablet Commonly known as: LASIX Take 10 mg by mouth daily.   LORazepam 1 MG tablet Commonly known as: ATIVAN Take 1.5 mg by mouth at bedtime as needed for anxiety or sleep.   melatonin 3 MG Tabs tablet Take 5 mg by mouth at bedtime.   pravastatin  20 MG tablet Commonly known as: PRAVACHOL  Take 1 tablet (20 mg total) by mouth 2 (two) times a week.    Past Medical History:  Diagnosis Date   Allergy to alpha-gal    Anxiety    Arthritis    Asthma    as child   Basal cell carcinoma of skin    R leg   Diverticulitis    Goiter, non-toxic    Hyperparathyroidism (HCC)    Hypertension    PONV (postoperative nausea and vomiting)    Seasonal allergies     Past Surgical History:  Procedure Laterality Date   arthroscopic knee  Left    BREAST  EXCISIONAL BIOPSY Left 2002   BREAST SURGERY     lumpectomy   CERVICAL FUSION     CHOLECYSTECTOMY     EYE SURGERY Bilateral    cataracts   KNEE ARTHROSCOPY Right 10/21/2014   Procedure: ARTHROSCOPY KNEE;  Surgeon: Marcey Raman, MD;  Location: [facility] OR;  Service: Orthopedics;  Laterality: Right;   PARATHYROIDECTOMY N/A 12/16/2022   Procedure: NECK EXPLORATION LEFT SUPERIOR PARATHYROIDECTOMY AND RIGHT SUPERIOR PARATHYROIDECTOMY;  Surgeon: Eletha Boas, MD;  Location: [facility] ORS;  Service: General;  Laterality: N/A;   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   SKIN CANCER EXCISION      Review of systems negative except as noted in HPI / PMHx or  noted below:  Review of Systems  Constitutional: Negative.   HENT: Negative.    Eyes: Negative.   Respiratory: Negative.    Cardiovascular: Negative.   Gastrointestinal: Negative.   Genitourinary: Negative.   Musculoskeletal: Negative.   Skin: Negative.   Neurological: Negative.   Endo/Heme/Allergies: Negative.   Psychiatric/Behavioral: Negative.       Objective:   Vitals:   09/22/23 1053 09/22/23 1113  BP: (!) 158/92 (!) 164/92  Pulse: 60   Resp: 12   SpO2: 97%    Height: 5' 5 (165.1 cm)  Weight: 138 lb 3.2 oz (62.7 kg)   Physical Exam Constitutional:      Appearance: She is not diaphoretic.  HENT:     Head: Normocephalic.     Right Ear: Tympanic membrane, ear canal and external ear normal.     Left Ear: Tympanic membrane, ear canal and external ear normal.     Nose: Nose normal. No mucosal edema or rhinorrhea.     Mouth/Throat:     Pharynx: Uvula midline. No oropharyngeal exudate.  Eyes:     Conjunctiva/sclera: Conjunctivae normal.  Neck:     Thyroid : No thyromegaly.     Trachea: Trachea normal. No tracheal tenderness or tracheal deviation.  Cardiovascular:     Rate and Rhythm: Normal rate and regular rhythm.     Heart sounds: Normal heart sounds, S1 normal and S2 normal. No murmur heard. Pulmonary:     Effort: No respiratory distress.     Breath sounds: Normal breath sounds. No stridor. No wheezing or rales.  Lymphadenopathy:     Head:     Right side of head: No tonsillar adenopathy.     Left side of head: No tonsillar adenopathy.     Cervical: No cervical adenopathy.  Skin:    Findings: No erythema or rash.     Nails: There is no clubbing.  Neurological:     Mental Status: She is alert.     Diagnostics: Results of blood tests obtained 30 June 2022 identifies alpha gal IgE 0.60 KU/L, lamb 0.38 KU/L, beef 0.38 KU/L, pork 0.27 KU/L   Assessment and Plan:   1. Adverse food reaction, subsequent encounter   2. Asthma, mild intermittent,  well-controlled   3. Perennial allergic rhinitis   4. Seasonal allergic rhinitis due to pollen   5. Gastroesophageal reflux disease, unspecified whether esophagitis present     1.  Allergen avoidance measures -mammal consumption  2.  Continue the following:  A. Flonase 1-2 sprays each nostril 1-2 times per day B. Zyrtec 10 mg - 1 tablet 1-2 times per day C. Famotidine 20 mg - 1 tablet 1-2 times per day  3.  Continue the following if needed:  A. albuterol HFA 2 inhalations every 4-6 hours B. Epi-Pen  4.  Blood -alpha gal panel  5.  Return to clinic in 1 year or earlier if problem  6. Plan for fall flu vaccine  7. Influenza = tamiflu. Covid = Paxlovid  Naz appears to be doing quite well on her current plan of allergen avoidance measures directed against mammal consumption and addressing her atopic respiratory disease and her reflux with the medications noted above.  Assuming she does well I will see her back in this clinic in 1 year.  I will contact her with the results of her alpha-gal panel once they are available for review.  Camellia Denis, MD Allergy / Immunology [facility]

## 2023-09-22 NOTE — Patient Instructions (Signed)
  1.  Allergen avoidance measures -mammal consumption  2.  Continue the following:  A. Flonase 1-2 sprays each nostril 1-2 times per day B. Zyrtec 10 mg - 1 tablet 1-2 times per day C. Famotidine 20 mg - 1 tablet 1-2 times per day  3.  Continue the following if needed:  A. albuterol HFA 2 inhalations every 4-6 hours B. Epi-Pen     4.  Blood -alpha gal panel  5.  Return to clinic in 1 year or earlier if problem  6. Plan for fall flu vaccine  7. Influenza = tamiflu. Covid = Paxlovid

## 2023-09-25 LAB — ALPHA-GAL PANEL
Allergen Lamb IgE: 1.35 kU/L — AB
Beef IgE: 1.46 kU/L — AB
IgE (Immunoglobulin E), Serum: 15 [IU]/mL (ref 6–495)
O215-IgE Alpha-Gal: 1.96 kU/L — AB
Pork IgE: 0.91 kU/L — AB

## 2023-09-26 ENCOUNTER — Ambulatory Visit: Payer: Self-pay | Admitting: Allergy and Immunology

## 2023-09-26 ENCOUNTER — Encounter: Payer: Self-pay | Admitting: Allergy and Immunology

## 2023-11-08 ENCOUNTER — Encounter: Payer: Self-pay | Admitting: Allergy and Immunology
